# Patient Record
Sex: Female | Born: 1960 | Race: White | Hispanic: No | Marital: Married | Smoking: Former smoker
Health system: Southern US, Community
[De-identification: ages and names within clinical notes are randomized; demographics above are authoritative.]

## PROBLEM LIST (undated history)

## (undated) DIAGNOSIS — I10 Essential (primary) hypertension: Secondary | ICD-10-CM

## (undated) DIAGNOSIS — J45909 Unspecified asthma, uncomplicated: Secondary | ICD-10-CM

## (undated) DIAGNOSIS — R053 Chronic cough: Secondary | ICD-10-CM

## (undated) DIAGNOSIS — F419 Anxiety disorder, unspecified: Secondary | ICD-10-CM

## (undated) DIAGNOSIS — J449 Chronic obstructive pulmonary disease, unspecified: Secondary | ICD-10-CM

## (undated) HISTORY — PX: TUBAL LIGATION: SHX77

## (undated) HISTORY — DX: Unspecified asthma, uncomplicated: J45.909

## (undated) HISTORY — DX: Essential (primary) hypertension: I10

---

## 2007-07-19 DIAGNOSIS — G4700 Insomnia, unspecified: Secondary | ICD-10-CM | POA: Insufficient documentation

## 2007-07-19 DIAGNOSIS — D649 Anemia, unspecified: Secondary | ICD-10-CM | POA: Insufficient documentation

## 2007-07-19 DIAGNOSIS — F341 Dysthymic disorder: Secondary | ICD-10-CM | POA: Insufficient documentation

## 2009-08-13 DIAGNOSIS — I10 Essential (primary) hypertension: Secondary | ICD-10-CM | POA: Insufficient documentation

## 2017-08-07 HISTORY — PX: BRONCHOSCOPY: SUR163

## 2017-09-13 DIAGNOSIS — E559 Vitamin D deficiency, unspecified: Secondary | ICD-10-CM | POA: Insufficient documentation

## 2017-11-18 ENCOUNTER — Encounter: Payer: Self-pay | Admitting: Pulmonary Disease

## 2017-11-18 ENCOUNTER — Other Ambulatory Visit (INDEPENDENT_AMBULATORY_CARE_PROVIDER_SITE_OTHER): Payer: Managed Care, Other (non HMO)

## 2017-11-18 ENCOUNTER — Ambulatory Visit (INDEPENDENT_AMBULATORY_CARE_PROVIDER_SITE_OTHER): Payer: Managed Care, Other (non HMO) | Admitting: Pulmonary Disease

## 2017-11-18 ENCOUNTER — Ambulatory Visit (INDEPENDENT_AMBULATORY_CARE_PROVIDER_SITE_OTHER)
Admission: RE | Admit: 2017-11-18 | Discharge: 2017-11-18 | Disposition: A | Discharge status: Home / Self Care | Payer: Managed Care, Other (non HMO) | Source: Ambulatory Visit | Attending: Pulmonary Disease | Admitting: Pulmonary Disease

## 2017-11-18 VITALS — BP 110/82 | HR 94 | Ht 69.0 in | Wt 163.2 lb

## 2017-11-18 DIAGNOSIS — R05 Cough: Secondary | ICD-10-CM | POA: Diagnosis not present

## 2017-11-18 DIAGNOSIS — R059 Cough, unspecified: Secondary | ICD-10-CM

## 2017-11-18 LAB — POCT EXHALED NITRIC OXIDE: FENO LEVEL (PPB): 13

## 2017-11-18 LAB — CBC WITH DIFFERENTIAL/PLATELET
BASOS ABS: 0 10*3/uL (ref 0.0–0.1)
Basophils Relative: 0.8 % (ref 0.0–3.0)
EOS ABS: 0.1 10*3/uL (ref 0.0–0.7)
Eosinophils Relative: 1.1 % (ref 0.0–5.0)
HCT: 42.2 % (ref 36.0–46.0)
HEMOGLOBIN: 14.2 g/dL (ref 12.0–15.0)
LYMPHS PCT: 36.1 % (ref 12.0–46.0)
Lymphs Abs: 1.7 10*3/uL (ref 0.7–4.0)
MCHC: 33.5 g/dL (ref 30.0–36.0)
MCV: 95.9 fl (ref 78.0–100.0)
MONO ABS: 0.6 10*3/uL (ref 0.1–1.0)
Monocytes Relative: 12.4 % — ABNORMAL HIGH (ref 3.0–12.0)
Neutro Abs: 2.3 10*3/uL (ref 1.4–7.7)
Neutrophils Relative %: 49.6 % (ref 43.0–77.0)
Platelets: 213 10*3/uL (ref 150.0–400.0)
RBC: 4.4 Mil/uL (ref 3.87–5.11)
RDW: 12.4 % (ref 11.5–15.5)
WBC: 4.7 10*3/uL (ref 4.0–10.5)

## 2017-11-18 MED ORDER — AZELASTINE-FLUTICASONE 137-50 MCG/ACT NA SUSP: 1.0000 | Freq: Two times a day (BID) | NASAL | 3 refills | Status: DC

## 2017-11-18 NOTE — Patient Instructions (Signed)
We will start you on chlorpheniramine 8 mg 3 times daily and Dymista nasal spray We will get a chest x-ray today, check blood work including CBC differential and blood allergy profile I try to get records from your pulmonologist had done well regarding the workup Follow-up in 1 month.

## 2017-11-18 NOTE — Progress Notes (Signed)
Theresa Franco    [id number]    Jul 07, 1961  Primary Care Physician:Zimmer, [name], MD  Referring Physician: No referring provider defined for this encounter.  Chief complaint: Consult for evaluation of cough.  HPI: 56 year old with past medical history of hypertension, asthma, allergies.  She has history of chronic cough for several years.  She is evaluated by Dr. [name], Pulmonologist at [place], [place].  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  She was given Xolair but had to stop after 1 dose as she developed severe hypertension.  She has been tried on breo, trelegy without any improvement in symptoms.  Her peripheral blood count does not show any eosinophilia and she was not a candidate for anti-IL5 therapy.  As per the patient she underwent a bronchoscopy in September 2018 to evaluate for endobronchial lesions.  Microscopic did not show any abnormalities. Medications significant for losartan.  She had been off this for > 1 year with no change in cough.  It had to be restarted after she developed hypertension after Xolair.  She has cough which is mostly nonproductive in nature.  This is more at night and sometimes keeps her up.  She has dyspnea with exertion.  Denies dyspnea at rest, no wheezing, fevers, chills.  Denies any heartburn symptoms.  Pets: 2 dogs but no birds, cats Occupation: Medical lab tech Exposures: Different exposure, no mold at home Smoking history: 10-pack-year history.  Quit in 1993. Travel History: Not significant  Outpatient Encounter Medications as of 11/18/2017  Medication Sig  . ALPRAZolam (XANAX) 0.5 MG tablet Take 0.5 mg by mouth at bedtime as needed for anxiety.  Marland Kitchen buPROPion (WELLBUTRIN SR) 150 MG 12 hr tablet Take 150 mg by mouth 2 (two) times daily.  Marland Kitchen losartan-hydrochlorothiazide (HYZAAR) 50-12.5 MG tablet Take 1 tablet by mouth daily.  . montelukast (SINGULAIR) 10 MG tablet Take 10 mg by mouth at bedtime.  .  Fluticasone-Umeclidin-Vilant (TRELEGY ELLIPTA) 100-62.5-25 MCG/INH AEPB Inhale into the lungs.   No facility-administered encounter medications on file as of 11/18/2017.     Allergies as of 11/18/2017 - Review Complete 11/18/2017  Allergen Reaction Noted  . Xolair [omalizumab]  11/18/2017    Past Medical History:  Diagnosis Date  . Hypertension     Past Surgical History:  Procedure Laterality Date  . BRONCHOSCOPY  08/2017  . TUBAL LIGATION      Family History  Problem Relation Age of Onset  . Asthma Mother   . Hypertension Mother   . Asthma Father   . COPD Father   . Hypertension Father     Social History   Socioeconomic History  . Marital status: Married    Spouse name: Not on file  . Number of children: Not on file  . Years of education: Not on file  . Highest education level: Not on file  Social Needs  . Financial resource strain: Not on file  . Food insecurity - worry: Not on file  . Food insecurity - inability: Not on file  . Transportation needs - medical: Not on file  . Transportation needs - non-medical: Not on file  Occupational History  . Not on file  Tobacco Use  . Smoking status: Former Smoker    Packs/day: 0.50    Years: 20.00    Pack years: 10.00    Types: Cigarettes    Last attempt to quit: 11/18/1992    Years since quitting: 25.0  . Smokeless  tobacco: Never Used  Substance and Sexual Activity  . Alcohol use: Yes    Comment: 3/week  . Drug use: No  . Sexual activity: Not on file  Other Topics Concern  . Not on file  Social History Narrative  . Not on file    Review of systems: Review of Systems  Constitutional: Negative for fever and chills.  HENT: Negative.   Eyes: Negative for blurred vision.  Respiratory: as per HPI  Cardiovascular: Negative for chest pain and palpitations.  Gastrointestinal: Negative for vomiting, diarrhea, blood per rectum. Genitourinary: Negative for dysuria, urgency, frequency and hematuria.    Musculoskeletal: Negative for myalgias, back pain and joint pain.  Skin: Negative for itching and rash.  Neurological: Negative for dizziness, tremors, focal weakness, seizures and loss of consciousness.  Endo/Heme/Allergies: Negative for environmental allergies.  Psychiatric/Behavioral: Negative for depression, suicidal ideas and hallucinations.  All other systems reviewed and are negative.  Physical Exam: Blood pressure 110/82, pulse 94, height 5\' 9"  (1.753 m), weight 163 lb 3.2 oz (74 kg), SpO2 95 %. Gen:      No acute distress HEENT:  EOMI, sclera anicteric Neck:     No masses; no thyromegaly Lungs:    Clear to auscultation bilaterally; normal respiratory effort CV:         Regular rate and rhythm; no murmurs Abd:      + bowel sounds; soft, non-tender; no palpable masses, no distension Ext:    No edema; adequate peripheral perfusion Skin:      Warm and dry; no rash Neuro: alert and oriented x 3 Psych: normal mood and affect  Data Reviewed: FENO 11/18/17-13  Assessment:  Assessment for chronic cough Suspect chronic allergies, sinusitis with postnasal drip causing upper airway cough Suspicion for asthma is low as symptoms are not typical and FENO is low. Check chest x-ray, check blood work including CBC with differential, blood allergy profile Obtain records from [place] recent pulmonary workup including PFTs  Start chlorpheniramine 8 mg 3 times daily and Dymista nasal spray for treatment of postnasal drip.  If symptoms continue then we may consider starting PPI for treatment of silent reflux.  Plan/Recommendations: - Chest x-ray, CBC differential, blood allergy profile - Obtain medical records - Chlorpheniramine 8 mg 3 times daily and Dymista nasal spray.  [name] MD [place] Pager [phone] 11/18/2017, 3:51 PM  CC: No ref. provider found

## 2017-11-19 LAB — RESPIRATORY ALLERGY PROFILE REGION II ~~LOC~~
ALLERGEN, CEDAR TREE, T6: 0.29 kU/L — AB
ALLERGEN, D PTERNOYSSINUS, D1: 0.1 kU/L — AB
ALLERGEN, MULBERRY, T70: 0.31 kU/L — AB
ALLERGEN, OAK, T7: 0.36 kU/L — AB
Allergen, A. alternata, m6: <0.1 kU/L
Allergen, Comm Silver Birch, t9: 0.28 kU/L — ABNORMAL HIGH
Allergen, Cottonwood, t14: 0.37 kU/L — ABNORMAL HIGH
Bermuda Grass: 0.48 kU/L — ABNORMAL HIGH
Box Elder IgE: 0.91 kU/L — ABNORMAL HIGH
CAT DANDER: 0.53 kU/L — AB
CLADOSPORIUM HERBARUM (M2) IGE: <0.1 kU/L
CLASS: 0
CLASS: 0
CLASS: 0
CLASS: 0
CLASS: 0
CLASS: 0
CLASS: 0
CLASS: 0
CLASS: 1
CLASS: 2
COCKROACH: 0.33 kU/L — AB
COMMON RAGWEED (SHORT) (W1) IGE: 0.45 kU/L — ABNORMAL HIGH
Class: 0
Class: 0
Class: 0
Class: 0
Class: 0
Class: 1
Class: 1
Class: 1
Class: 1
Class: 1
Class: 1
Class: 1
Class: 1
Class: 1
D. FARINAE: 0.12 kU/L — AB
Dog Dander: 0.62 kU/L — ABNORMAL HIGH
ELM IGE: 0.33 kU/L — AB
IGE (IMMUNOGLOBULIN E), SERUM: 752 kU/L — AB (ref <=114)
JOHNSON GRASS: 0.45 kU/L — AB
Pecan/Hickory Tree IgE: 0.3 kU/L — ABNORMAL HIGH
ROUGH PIGWEED IGE: 0.45 kU/L — AB
Sheep Sorrel IgE: 0.45 kU/L — ABNORMAL HIGH
TIMOTHY GRASS: 0.59 kU/L — AB

## 2017-11-19 LAB — INTERPRETATION:

## 2017-12-15 ENCOUNTER — Ambulatory Visit: Payer: Self-pay | Admitting: Allergy & Immunology

## 2017-12-23 ENCOUNTER — Ambulatory Visit: Payer: Managed Care, Other (non HMO) | Admitting: Pulmonary Disease

## 2018-01-06 ENCOUNTER — Telehealth: Payer: Self-pay

## 2018-01-06 ENCOUNTER — Ambulatory Visit (INDEPENDENT_AMBULATORY_CARE_PROVIDER_SITE_OTHER): Payer: Managed Care, Other (non HMO) | Admitting: Pulmonary Disease

## 2018-01-06 ENCOUNTER — Encounter: Payer: Self-pay | Admitting: Pulmonary Disease

## 2018-01-06 VITALS — BP 108/70 | HR 76 | Ht 69.0 in | Wt 164.0 lb

## 2018-01-06 DIAGNOSIS — R05 Cough: Secondary | ICD-10-CM

## 2018-01-06 DIAGNOSIS — R059 Cough, unspecified: Secondary | ICD-10-CM

## 2018-01-06 LAB — NITRIC OXIDE: Nitric Oxide: 14

## 2018-01-06 MED ORDER — HYDROCODONE-HOMATROPINE 5-1.5 MG/5ML PO SYRP: 5.0000 mL | ORAL_SOLUTION | Freq: Four times a day (QID) | ORAL | 0 refills | Status: DC | PRN

## 2018-01-06 NOTE — Patient Instructions (Signed)
Continue the chlorphentermine and Dymista nasal spray Continue albuterol as needed We will try again to obtain the records from Dr. [name] I have reviewed your allergy profile.  There are allergies to multiple environmental agents including dust mite, dog, cat, pollen.  You may benefit from evaluation by allergy We will give you a prescription for Hycodan cough syrup. Follow-up in 1-2 months.

## 2018-01-06 NOTE — Telephone Encounter (Signed)
Medical release from has been sent to [facility] requesting PFT. Will await records.

## 2018-01-06 NOTE — Telephone Encounter (Signed)
Records have been received and placed in PM's cubby for review.  Nothing further is needed.  

## 2018-01-06 NOTE — Progress Notes (Addendum)
Theresa Franco    [id number]    [birth year]  Primary Care Physician:Zimmer, [name], MD  Referring Physician: Olena Mater, [place] [address]  Chief complaint: Follow up for cough.  HPI: 57 year old with past medical history of hypertension, asthma, allergies.  She has history of chronic cough for several years.  She is evaluated by Dr. [name], Pulmonologist at [place], [place].  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  She was given Xolair but had to stop after 1 dose as she developed severe hypertension.  She has been tried on breo, trelegy without any improvement in symptoms.  Her peripheral blood count does not show any eosinophilia and she was not a candidate for anti-IL5 therapy.  As per the patient she underwent a bronchoscopy in September 2018 to evaluate for endobronchial lesions.  Microscopic did not show any abnormalities. Medications significant for losartan.  She had been off this for > 1 year with no change in cough.  It had to be restarted after she developed hypertension after Xolair.  She has cough which is mostly nonproductive in nature.  This is more at night and sometimes keeps her up.  She has dyspnea with exertion.  Denies dyspnea at rest, no wheezing, fevers, chills.  Denies any heartburn symptoms.  Pets: 2 dogs but no birds, cats Occupation: Medical lab tech Exposures: Different exposure, no mold at home Smoking history: 10-pack-year history.  Quit in 1993. Travel History: Not significant  Interim history: She was started on chlorpheniramine and Dymista nasal spray at last visit.  She reports improvement in symptoms.  However over the past week she has viral upper respiratory tract infection with increasing congestion.  Cough with clear mucus.  Denies any fevers, chills  Outpatient Encounter Medications as of 01/06/2018  Medication Sig  . ALPRAZolam (XANAX) 0.5 MG tablet Take 0.5 mg by mouth at bedtime as needed  for anxiety.  . Azelastine-Fluticasone (DYMISTA) 137-50 MCG/ACT SUSP Place 1 spray into the nose 2 (two) times daily.  Marland Kitchen buPROPion (WELLBUTRIN SR) 150 MG 12 hr tablet Take 150 mg by mouth 2 (two) times daily.  Marland Kitchen losartan-hydrochlorothiazide (HYZAAR) 50-12.5 MG tablet Take 1 tablet by mouth daily.  . montelukast (SINGULAIR) 10 MG tablet Take 10 mg by mouth at bedtime.  . [DISCONTINUED] Fluticasone-Umeclidin-Vilant (TRELEGY ELLIPTA) 100-62.5-25 MCG/INH AEPB Inhale into the lungs.   No facility-administered encounter medications on file as of 01/06/2018.     Allergies as of 01/06/2018 - Review Complete 01/06/2018  Allergen Reaction Noted  . Xolair [omalizumab]  11/18/2017    Past Medical History:  Diagnosis Date  . Hypertension     Past Surgical History:  Procedure Laterality Date  . BRONCHOSCOPY  08/2017  . TUBAL LIGATION      Family History  Problem Relation Age of Onset  . Asthma Mother   . Hypertension Mother   . Asthma Father   . COPD Father   . Hypertension Father     Social History   Socioeconomic History  . Marital status: Married    Spouse name: Not on file  . Number of children: Not on file  . Years of education: Not on file  . Highest education level: Not on file  Social Needs  . Financial resource strain: Not on file  . Food insecurity - worry: Not on file  . Food insecurity - inability: Not on file  . Transportation needs - medical: Not on  file  . Transportation needs - non-medical: Not on file  Occupational History  . Not on file  Tobacco Use  . Smoking status: Former Smoker    Packs/day: 0.50    Years: 20.00    Pack years: 10.00    Types: Cigarettes    Last attempt to quit: 11/18/1992    Years since quitting: 25.1  . Smokeless tobacco: Never Used  Substance and Sexual Activity  . Alcohol use: Yes    Comment: 3/week  . Drug use: No  . Sexual activity: Not on file  Other Topics Concern  . Not on file  Social History Narrative  . Not on file    Review of systems: Review of Systems  Constitutional: Negative for fever and chills.  HENT: Negative.   Eyes: Negative for blurred vision.  Respiratory: as per HPI  Cardiovascular: Negative for chest pain and palpitations.  Gastrointestinal: Negative for vomiting, diarrhea, blood per rectum. Genitourinary: Negative for dysuria, urgency, frequency and hematuria.  Musculoskeletal: Negative for myalgias, back pain and joint pain.  Skin: Negative for itching and rash.  Neurological: Negative for dizziness, tremors, focal weakness, seizures and loss of consciousness.  Endo/Heme/Allergies: Negative for environmental allergies.  Psychiatric/Behavioral: Negative for depression, suicidal ideas and hallucinations.  All other systems reviewed and are negative.  Physical Exam: Blood pressure 108/70, pulse 76, height 5\' 9"  (1.753 m), weight 164 lb (74.4 kg), SpO2 99 %. Gen:      No acute distress HEENT:  EOMI, sclera anicteric Neck:     No masses; no thyromegaly Lungs:    Clear to auscultation bilaterally; normal respiratory effort CV:         Regular rate and rhythm; no murmurs Abd:      + bowel sounds; soft, non-tender; no palpable masses, no distension Ext:    No edema; adequate peripheral perfusion Skin:      Warm and dry; no rash Neuro: alert and oriented x 3 Psych: normal mood and affect  Data Reviewed: FENO 11/18/17-13 FENO 01/06/18-14  CBC 11/18/17-WBC 4.7, eosinophils 1.1%, absolute eos count 100 RAST panel 11/18/17-IgE 752, sensitive to multiple allergens including cats, dogs, dust mite, pollen  Chest x-ray 11/18/17- no active cardiopulmonary disease.  I have reviewed the images personally  Assessment:  Chronic cough Suspect chronic allergies, sinusitis with postnasal drip causing upper airway cough Suspicion for asthma is low as symptoms are not typical and FENO is low.  Multiple inhalers were tried in the past without any improvement.  Will continue on albuterol as  needed. Obtain records from [place] recent pulmonary workup including PFTs  Continue Singulair, chlorpheniramine 8 mg 3 times daily and Dymista nasal spray for treatment of postnasal drip.  If symptoms continue then we may consider starting PPI for treatment of silent reflux. Prescribed Hycodan cough syrup. Recommend allergy evaluation given high IgE levels with multiple allergens sensitivities.  Patient will schedule an appointment on her own  Plan/Recommendations: - Obtain medical records - Chlorpheniramine 8 mg 3 times daily and Dymista nasal spray. - Hycodan cough syrup  [name] MD [facility] Pager [phone] 01/06/2018, 9:44 AM  CC: Olena Mater, MD  Addendum Spirometry from [place], [place] 03/10/17 FVC 2.71 [71%], pre-FEV1 1.53 [54%], post FEV1 1.70 [59%], % change 11 F/F 63 Moderate obstruction with improvement in flow rates especially mid flows post bronchodilator suggestive of small airways disease.

## 2018-01-22 NOTE — Telephone Encounter (Signed)
Please let the pt know that I got the records.  I would like to repeat full PFTs here as it has been a year since the last test. Can order on day of return visit. Thanks  [name] MD [place] [facility] Pager [phone] If no answer or after 3pm call: [phone] 01/22/2018, 3:35 PM

## 2018-01-27 NOTE — Telephone Encounter (Signed)
Pt has been scheduled for PFT on 03/03/18 at 2:00. Pt is aware and voiced her understanding. Nothing further is needed.

## 2018-01-27 NOTE — Addendum Note (Signed)
Addended by: Maryanna Shape A on: 01/27/2018 08:54 AM   Modules accepted: Orders

## 2018-03-03 ENCOUNTER — Ambulatory Visit (INDEPENDENT_AMBULATORY_CARE_PROVIDER_SITE_OTHER): Payer: Managed Care, Other (non HMO) | Admitting: Pulmonary Disease

## 2018-03-03 ENCOUNTER — Encounter: Payer: Self-pay | Admitting: Pulmonary Disease

## 2018-03-03 ENCOUNTER — Other Ambulatory Visit (INDEPENDENT_AMBULATORY_CARE_PROVIDER_SITE_OTHER): Payer: Managed Care, Other (non HMO)

## 2018-03-03 VITALS — BP 118/70 | HR 89 | Ht 69.0 in | Wt 162.2 lb

## 2018-03-03 DIAGNOSIS — J441 Chronic obstructive pulmonary disease with (acute) exacerbation: Secondary | ICD-10-CM

## 2018-03-03 DIAGNOSIS — R05 Cough: Secondary | ICD-10-CM

## 2018-03-03 DIAGNOSIS — R0602 Shortness of breath: Secondary | ICD-10-CM

## 2018-03-03 DIAGNOSIS — J449 Chronic obstructive pulmonary disease, unspecified: Secondary | ICD-10-CM | POA: Diagnosis not present

## 2018-03-03 DIAGNOSIS — R059 Cough, unspecified: Secondary | ICD-10-CM

## 2018-03-03 LAB — CBC WITH DIFFERENTIAL/PLATELET
BASOS ABS: 0 10*3/uL (ref 0.0–0.1)
Basophils Relative: 0.3 % (ref 0.0–3.0)
EOS ABS: 0.2 10*3/uL (ref 0.0–0.7)
Eosinophils Relative: 1.1 % (ref 0.0–5.0)
HEMATOCRIT: 41.7 % (ref 36.0–46.0)
HEMOGLOBIN: 14.1 g/dL (ref 12.0–15.0)
LYMPHS PCT: 9 % — AB (ref 12.0–46.0)
Lymphs Abs: 1.3 10*3/uL (ref 0.7–4.0)
MCHC: 33.9 g/dL (ref 30.0–36.0)
MCV: 95.7 fl (ref 78.0–100.0)
MONO ABS: 0.9 10*3/uL (ref 0.1–1.0)
Monocytes Relative: 6 % (ref 3.0–12.0)
NEUTROS ABS: 12.1 10*3/uL — AB (ref 1.4–7.7)
Neutrophils Relative %: 83.6 % — ABNORMAL HIGH (ref 43.0–77.0)
PLATELETS: 227 10*3/uL (ref 150.0–400.0)
RBC: 4.36 Mil/uL (ref 3.87–5.11)
RDW: 12.9 % (ref 11.5–15.5)
WBC: 14.5 10*3/uL — AB (ref 4.0–10.5)

## 2018-03-03 LAB — PULMONARY FUNCTION TEST
DL/VA % PRED: 113 %
DL/VA: 6.05 ml/min/mmHg/L
DLCO unc % pred: 67 %
DLCO unc: 20.88 ml/min/mmHg
FEF 25-75 POST: 0.33 L/s
FEF 25-75 Pre: 0.4 L/sec
FEF2575-%CHANGE-POST: -18 %
FEF2575-%PRED-PRE: 13 %
FEF2575-%Pred-Post: 11 %
FEV1-%Change-Post: -3 %
FEV1-%Pred-Post: 31 %
FEV1-%Pred-Pre: 32 %
FEV1-PRE: 1.03 L
FEV1-Post: 1 L
FEV1FVC-%CHANGE-POST: 7 %
FEV1FVC-%PRED-PRE: 69 %
FEV6-%Change-Post: -7 %
FEV6-%Pred-Post: 42 %
FEV6-%Pred-Pre: 46 %
FEV6-Post: 1.7 L
FEV6-Pre: 1.83 L
FEV6FVC-%Change-Post: 2 %
FEV6FVC-%PRED-POST: 103 %
FEV6FVC-%Pred-Pre: 100 %
FVC-%Change-Post: -9 %
FVC-%PRED-PRE: 46 %
FVC-%Pred-Post: 41 %
FVC-POST: 1.7 L
FVC-PRE: 1.88 L
PRE FEV1/FVC RATIO: 55 %
Post FEV1/FVC ratio: 59 %
Post FEV6/FVC ratio: 100 %
Pre FEV6/FVC Ratio: 97 %
RV % pred: 176 %
RV: 3.77 L
TLC % PRED: 101 %
TLC: 5.89 L

## 2018-03-03 LAB — BASIC METABOLIC PANEL
BUN: 13 mg/dL (ref 6–23)
CO2: 30 mEq/L (ref 19–32)
Calcium: 9.6 mg/dL (ref 8.4–10.5)
Chloride: 102 mEq/L (ref 96–112)
Creatinine, Ser: 1.1 mg/dL (ref 0.40–1.20)
GFR: 54.82 mL/min — AB (ref >60.00)
Glucose, Bld: 115 mg/dL — ABNORMAL HIGH (ref 70–99)
POTASSIUM: 4 meq/L (ref 3.5–5.1)
SODIUM: 139 meq/L (ref 135–145)

## 2018-03-03 MED ORDER — FLUTICASONE-UMECLIDIN-VILANT 100-62.5-25 MCG/INH IN AEPB: 1.0000 | INHALATION_SPRAY | Freq: Every day | RESPIRATORY_TRACT | 5 refills | Status: DC

## 2018-03-03 MED ORDER — AZELASTINE-FLUTICASONE 137-50 MCG/ACT NA SUSP: 1.0000 | Freq: Two times a day (BID) | NASAL | 3 refills | Status: DC

## 2018-03-03 MED ORDER — HYDROCODONE-HOMATROPINE 5-1.5 MG/5ML PO SYRP: 5.0000 mL | ORAL_SOLUTION | Freq: Four times a day (QID) | ORAL | 0 refills | Status: DC | PRN

## 2018-03-03 MED ORDER — FLUTICASONE-UMECLIDIN-VILANT 100-62.5-25 MCG/INH IN AEPB: 1.0000 | INHALATION_SPRAY | Freq: Every day | RESPIRATORY_TRACT | 0 refills | Status: AC

## 2018-03-03 MED ORDER — IPRATROPIUM-ALBUTEROL 0.5-2.5 (3) MG/3ML IN SOLN: 3.0000 mL | Freq: Four times a day (QID) | RESPIRATORY_TRACT | 3 refills | Status: AC | PRN

## 2018-03-03 MED ORDER — PREDNISONE 10 MG PO TABS: ORAL_TABLET | ORAL | 0 refills | Status: DC

## 2018-03-03 MED ORDER — ALBUTEROL SULFATE HFA 108 (90 BASE) MCG/ACT IN AERS: 2.0000 | INHALATION_SPRAY | Freq: Four times a day (QID) | RESPIRATORY_TRACT | 3 refills | Status: DC | PRN

## 2018-03-03 NOTE — Patient Instructions (Addendum)
We will refill your pro-air We will get you started on trelegy, nebulizer with duo nebs every 6 hours as needed We will start you on a prednisone taper starting at 40 mg.  Reduce dose by 10 mg every 3 days. Check CBC differential, BMP, alpha-1 antitrypsin levels and phenotype Schedule for high-resolution CT of the chest Follow-up in 1 month.

## 2018-03-03 NOTE — Progress Notes (Signed)
PFT completed today.  

## 2018-03-03 NOTE — Progress Notes (Addendum)
Theresa Franco    [id number]    1961/03/27  Primary Care Physician:Zimmer, [name], MD  Referring Physician: Olena Mater, [place] [address]  Chief complaint: Follow up for cough, COPD  HPI: 57 year old with past medical history of hypertension, asthma, allergies.  She has history of chronic cough for several years.  She is evaluated by Dr. [name], Pulmonologist at [place], [place].  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  She was given Xolair but had to stop after 1 dose as she developed severe hypertension.  She has been tried on breo, trelegy without any improvement in symptoms.  Her peripheral blood count does not show any eosinophilia and she was not a candidate for anti-IL5 therapy.  As per the patient she underwent a bronchoscopy in September 2018 to evaluate for endobronchial lesions which did not show any abnormalities. Medications significant for losartan.  She had been off this for > 1 year with no change in cough.  It had to be restarted after she developed hypertension after Xolair.  She has cough which is mostly nonproductive in nature.  This is more at night and sometimes keeps her up.  She has dyspnea with exertion.  Denies dyspnea at rest, no wheezing, fevers, chills.  Denies any heartburn symptoms.  Pets: 2 dogs but no birds, cats Occupation: Medical lab tech Exposures: No exposure, no mold at home Smoking history: 10-pack-year history.  Quit in 1993. Travel History: Not significant  Interim history: Has worsening dyspnea over the past few weeks.  She has paroxysms of cough with panic attacks, wheezing.  Denies any sputum production, fevers, chills.  Outpatient Encounter Medications as of 03/03/2018  Medication Sig  . ALPRAZolam (XANAX) 0.5 MG tablet Take 0.5 mg by mouth at bedtime as needed for anxiety.  . Azelastine-Fluticasone (DYMISTA) 137-50 MCG/ACT SUSP Place 1 spray into the nose 2 (two) times daily.  Marland Kitchen  buPROPion (WELLBUTRIN SR) 150 MG 12 hr tablet Take 150 mg by mouth 2 (two) times daily.  Marland Kitchen losartan-hydrochlorothiazide (HYZAAR) 50-12.5 MG tablet Take 1 tablet by mouth daily.  . montelukast (SINGULAIR) 10 MG tablet Take 10 mg by mouth at bedtime.  . [DISCONTINUED] Azelastine-Fluticasone (DYMISTA) 137-50 MCG/ACT SUSP Place 1 spray into the nose 2 (two) times daily.  Marland Kitchen albuterol (PROAIR HFA) 108 (90 Base) MCG/ACT inhaler Inhale 2 puffs into the lungs every 6 (six) hours as needed for wheezing or shortness of breath.  Marland Kitchen HYDROcodone-homatropine (HYCODAN) 5-1.5 MG/5ML syrup Take 5 mLs by mouth every 6 (six) hours as needed for cough. (Patient not taking: Reported on 03/03/2018)   No facility-administered encounter medications on file as of 03/03/2018.     Allergies as of 03/03/2018 - Review Complete 03/03/2018  Allergen Reaction Noted  . Xolair [omalizumab]  11/18/2017    Past Medical History:  Diagnosis Date  . Hypertension     Past Surgical History:  Procedure Laterality Date  . BRONCHOSCOPY  08/2017  . TUBAL LIGATION      Family History  Problem Relation Age of Onset  . Asthma Mother   . Hypertension Mother   . Asthma Father   . COPD Father   . Hypertension Father     Social History   Socioeconomic History  . Marital status: Married    Spouse name: Not on file  . Number of children: Not on file  . Years of education: Not on file  . Highest education level:  Not on file  Occupational History  . Not on file  Social Needs  . Financial resource strain: Not on file  . Food insecurity:    Worry: Not on file    Inability: Not on file  . Transportation needs:    Medical: Not on file    Non-medical: Not on file  Tobacco Use  . Smoking status: Former Smoker    Packs/day: 0.50    Years: 20.00    Pack years: 10.00    Types: Cigarettes    Last attempt to quit: 11/18/1992    Years since quitting: 25.3  . Smokeless tobacco: Never Used  Substance and Sexual Activity  .  Alcohol use: Yes    Comment: 3/week  . Drug use: No  . Sexual activity: Not on file  Lifestyle  . Physical activity:    Days per week: Not on file    Minutes per session: Not on file  . Stress: Not on file  Relationships  . Social connections:    Talks on phone: Not on file    Gets together: Not on file    Attends religious service: Not on file    Active member of club or organization: Not on file    Attends meetings of clubs or organizations: Not on file    Relationship status: Not on file  . Intimate partner violence:    Fear of current or ex partner: Not on file    Emotionally abused: Not on file    Physically abused: Not on file    Forced sexual activity: Not on file  Other Topics Concern  . Not on file  Social History Narrative  . Not on file   Review of systems: Review of Systems  Constitutional: Negative for fever and chills.  HENT: Negative.   Eyes: Negative for blurred vision.  Respiratory: as per HPI  Cardiovascular: Negative for chest pain and palpitations.  Gastrointestinal: Negative for vomiting, diarrhea, blood per rectum. Genitourinary: Negative for dysuria, urgency, frequency and hematuria.  Musculoskeletal: Negative for myalgias, back pain and joint pain.  Skin: Negative for itching and rash.  Neurological: Negative for dizziness, tremors, focal weakness, seizures and loss of consciousness.  Endo/Heme/Allergies: Negative for environmental allergies.  Psychiatric/Behavioral: Negative for depression, suicidal ideas and hallucinations.  All other systems reviewed and are negative.  Physical Exam: Blood pressure 108/70, pulse 76, height 5\' 9"  (1.753 m), weight 164 lb (74.4 kg), SpO2 99 %. Gen:      No acute distress HEENT:  EOMI, sclera anicteric Neck:     No masses; no thyromegaly Lungs:    Clear to auscultation bilaterally; normal respiratory effort CV:         Regular rate and rhythm; no murmurs Abd:      + bowel sounds; soft, non-tender; no palpable  masses, no distension Ext:    No edema; adequate peripheral perfusion Skin:      Warm and dry; no rash Neuro: alert and oriented x 3 Psych: normal mood and affect  Data Reviewed: FENO 11/18/17-13 FENO 01/06/18-14  CBC 11/18/17-WBC 4.7, eosinophils 1.1%, absolute eos count 100 RAST panel 11/18/17-IgE 752, sensitive to multiple allergens including cats, dogs, dust mite, pollen  Chest x-ray 11/18/17- no active cardiopulmonary disease.  I have reviewed the images personally  Spirometry from [place], [place] 03/10/17 FVC 2.71 [71%], pre-FEV1 1.53 [54%], post FEV1 1.70 [59%], % change 11 F/F 63 Moderate obstruction with improvement in flow rates especially mid flows post bronchodilator suggestive of small airways  disease.  PFTs 03/03/18 FVC 1.70 (41%], FEV1 1.00 (31%], F/F 59, TLC 101, RV/TLC 173%, DLCO 67% Severe obstruction with air trapping, moderate diffusion defect  Assessment:  Severe COPD PFTs reviewed with severe obstruction, air trapping.  She appears to be in the middle of an exacerbation Give prednisone taper, start trelegy inhaler, duo nebs as needed Continue pro-air rescue inhaler Check alpha-1 antitrypsin levels as her obstruction is worse than expected relative to smoking history  Chronic cough Has chronic allergies, sinusitis with postnasal drip causing upper airway cough Suspicion for asthma is low as symptoms are not typical and FENO is low.  Continue Singulair, chlorpheniramine 8 mg 3 times daily and Dymista nasal spray for treatment of postnasal drip.  Consider starting PPI for treatment of silent reflux. She has an allergy consultation pending in the next few weeks Continue Hycodan cough syrup.  Plan/Recommendations: - Prednisone taper - Start trelegy, duo nebs - Hycodan cough syrup -Continue Singulair, chlorphentermine, Dymista - Allergy eval  [name] MD [facility] Pager [phone] 03/03/2018, 3:24 PM  CC: Olena Mater, MD

## 2018-03-09 ENCOUNTER — Encounter: Payer: Self-pay | Admitting: Pulmonary Disease

## 2018-03-09 NOTE — Telephone Encounter (Signed)
Dr Vaughan Browner,  Please advise on recent lab results, thanks

## 2018-03-10 ENCOUNTER — Ambulatory Visit (HOSPITAL_COMMUNITY)
Admission: RE | Admit: 2018-03-10 | Discharge: 2018-03-10 | Disposition: A | Discharge status: Home / Self Care | Payer: Managed Care, Other (non HMO) | Source: Ambulatory Visit | Attending: Pulmonary Disease | Admitting: Pulmonary Disease

## 2018-03-10 DIAGNOSIS — I700 Atherosclerosis of aorta: Secondary | ICD-10-CM | POA: Insufficient documentation

## 2018-03-10 DIAGNOSIS — R918 Other nonspecific abnormal finding of lung field: Secondary | ICD-10-CM | POA: Diagnosis not present

## 2018-03-10 DIAGNOSIS — R0602 Shortness of breath: Secondary | ICD-10-CM | POA: Diagnosis present

## 2018-03-10 LAB — ALPHA-1 ANTITRYPSIN PHENOTYPE: A-1 Antitrypsin, Ser: 204 mg/dL — ABNORMAL HIGH (ref 83–199)

## 2018-03-10 NOTE — Telephone Encounter (Signed)
Labs show slight elevation in WBC count and allergy testing confirms severe allergies. Other lab tests are normal.

## 2018-03-14 ENCOUNTER — Encounter: Payer: Self-pay | Admitting: Pulmonary Disease

## 2018-03-14 NOTE — Telephone Encounter (Signed)
Dr. Vaughan Browner,  Please advise on results of ct chest 03/11/18, thanks

## 2018-03-14 NOTE — Telephone Encounter (Signed)
There is mild inflammation in the lungs. We will need to review any inhalation exposures when she returns to office and maybe get additional lab tests. There are small lung nodules that appear benign. There are no worrisome findings. I will review in detail during office visit.

## 2018-03-15 NOTE — Telephone Encounter (Signed)
Passed on information to patient via email. Nothing further needed at this time.

## 2018-03-23 ENCOUNTER — Encounter: Payer: Self-pay | Admitting: Allergy & Immunology

## 2018-03-23 ENCOUNTER — Ambulatory Visit: Payer: Managed Care, Other (non HMO) | Admitting: Allergy & Immunology

## 2018-03-23 VITALS — BP 122/72 | HR 86 | Temp 98.1°F | Resp 17 | Ht 68.0 in | Wt 165.0 lb

## 2018-03-23 DIAGNOSIS — J449 Chronic obstructive pulmonary disease, unspecified: Secondary | ICD-10-CM

## 2018-03-23 DIAGNOSIS — J302 Other seasonal allergic rhinitis: Secondary | ICD-10-CM

## 2018-03-23 DIAGNOSIS — K219 Gastro-esophageal reflux disease without esophagitis: Secondary | ICD-10-CM

## 2018-03-23 DIAGNOSIS — J3089 Other allergic rhinitis: Secondary | ICD-10-CM | POA: Diagnosis not present

## 2018-03-23 MED ORDER — OMEPRAZOLE 40 MG PO CPDR: 40.0000 mg | DELAYED_RELEASE_CAPSULE | Freq: Every day | ORAL | 5 refills | Status: DC

## 2018-03-23 MED ORDER — CETIRIZINE HCL 10 MG PO TABS: 10.0000 mg | ORAL_TABLET | Freq: Every day | ORAL | 5 refills | Status: DC

## 2018-03-23 NOTE — Patient Instructions (Addendum)
1. Severe persistent asthma, uncomplicated - Lung testing did not look great today, but it was better than the one you had at the end of March.  - There was some very slight improvement with the nebulizer treatment. - We will not make changes at this time, but we will get a CBC to look for eosinphils (I do not want to get this now since you recently completed a round of prednisone). - I will also get some labs to look for ABPA.  - These labs can be drawn when you see Dr. Rolla Etienne later this month. - He may also want to look further into the hypersensitivity pneumonitis diagnosis. - We will get the outside records from Dr. Audie Box to see what he did. - I will also touch base with Dr. Rolla Etienne.  - Daily controller medication(s): Singulair 10mg  daily, Arnuity 284mcg one puff once daily and Trelegy 100/62.5/25 one puff once daily - Prior to physical activity: ProAir 2 puffs 10-15 minutes before physical activity. - Rescue medications: DuoNeb nebulizer one vial every 4-6 hours as needed - Asthma control goals:  * Full participation in all desired activities (may need albuterol before activity) * Albuterol use two time or less a week on average (not counting use with activity) * Cough interfering with sleep two time or less a month * Oral steroids no more than once a year * No hospitalizations  2. Seasonal and perennial allergic rhinitis (grasses, weeds, trees, dust mite, cat, dog, cockroach) - Testing today was negative to the molds tested. - Allergy shots are a possibility, but I think we need to get your breathing problems under better control. - Stop taking: chlorpheniramine - Continue with: Dymista (fluticasone/azelastine) two sprays per nostril 1-2 times daily as needed - Start taking: Zyrtec (cetirizine) 10mg  tablet once daily - You can use an extra dose of the antihistamine, if needed, for breakthrough symptoms.  - Consider nasal saline rinses 1-2 times daily to remove allergens from the nasal  cavities as well as help with mucous clearance (this is especially helpful to do before the nasal sprays are given) - Consider allergy shots as a means of long-term control. - Allergy shots "re-train" and "reset" the immune system to ignore environmental allergens and decrease the resulting immune response to those allergens (sneezing, itchy watery eyes, runny nose, nasal congestion, etc).    - Allergy shots improve symptoms in 75-85% of patients.  - We can discuss more at the next appointment if the medications are not working for you.  3. Gastroesophageal reflux disease - Stop the ranitidine and start omeprazole 40mg  daily.  4. Return in about 6 weeks (around 05/04/2018).   Please inform us of any Emergency Department visits, hospitalizations, or changes in symptoms. Call us before going to the ED for breathing or allergy symptoms since we might be able to fit you in for a sick visit. Feel free to contact us anytime with any questions, problems, or concerns.  It was a pleasure to meet you today!  Websites that have reliable patient information: 1. American Academy of Asthma, Allergy, and Immunology: www.aaaai.org 2. Food Allergy Research and Education (FARE): foodallergy.org 3. Mothers of Asthmatics: http://www.asthmacommunitynetwork.org 4. American College of Allergy, Asthma, and Immunology: www.acaai.org  Reducing Pollen Exposure  The American Academy of Allergy, Asthma and Immunology suggests the following steps to reduce your exposure to pollen during allergy seasons.    1. Do not hang sheets or clothing out to dry; pollen may collect on these items. 2. Do  not mow lawns or spend time around freshly cut grass; mowing stirs up pollen. 3. Keep windows closed at night.  Keep car windows closed while driving. 4. Minimize morning activities outdoors, a time when pollen counts are usually at their highest. 5. Stay indoors as much as possible when pollen counts or humidity is high and on  windy days when pollen tends to remain in the air longer. 6. Use air conditioning when possible.  Many air conditioners have filters that trap the pollen spores. 7. Use a HEPA room air filter to remove pollen form the indoor air you breathe.  Control of House Dust Mite Allergen    House dust mites play a major role in allergic asthma and rhinitis.  They occur in environments with high humidity wherever human skin, the food for dust mites is found. High levels have been detected in dust obtained from mattresses, pillows, carpets, upholstered furniture, bed covers, clothes and soft toys.  The principal allergen of the house dust mite is found in its feces.  A gram of dust may contain 1,000 mites and 250,000 fecal particles.  Mite antigen is easily measured in the air during house cleaning activities.    1. Encase mattresses, including the box spring, and pillow, in an air tight cover.  Seal the zipper end of the encased mattresses with wide adhesive tape. 2. Wash the bedding in water of 130 degrees Farenheit weekly.  Avoid cotton comforters/quilts and flannel bedding: the most ideal bed covering is the dacron comforter. 3. Remove all upholstered furniture from the bedroom. 4. Remove carpets, carpet padding, rugs, and non-washable window drapes from the bedroom.  Wash drapes weekly or use plastic window coverings. 5. Remove all non-washable stuffed toys from the bedroom.  Wash stuffed toys weekly. 6. Have the room cleaned frequently with a vacuum cleaner and a damp dust-mop.  The patient should not be in a room which is being cleaned and should wait 1 hour after cleaning before going into the room. 7. Close and seal all heating outlets in the bedroom.  Otherwise, the room will become filled with dust-laden air.  An electric heater can be used to heat the room. 8. Reduce indoor humidity to less than 50%.  Do not use a humidifier.  Control of Cockroach Allergen  Cockroach allergen has been  identified as an important cause of acute attacks of asthma, especially in urban settings.  There are fifty-five species of cockroach that exist in the [place], however only three, the [place], [place] species produce allergen that can affect patients with Asthma.  Allergens can be obtained from fecal particles, egg casings and secretions from cockroaches.    1. Remove food sources. 2. Reduce access to water. 3. Seal access and entry points. 4. Spray runways with 0.5-1% Diazinon or Chlorpyrifos 5. Blow boric acid power under stoves and refrigerator. 6. Place bait stations (hydramethylnon) at feeding sites.  Control of Dog or Cat Allergen  Avoidance is the best way to manage a dog or cat allergy. If you have a dog or cat and are allergic to dog or cats, consider removing the dog or cat from the home. If you have a dog or cat but don't want to find it a new home, or if your family wants a pet even though someone in the household is allergic, here are some strategies that may help keep symptoms at bay:  1. Keep the pet out of your bedroom and restrict it to only a  few rooms. Be advised that keeping the dog or cat in only one room will not limit the allergens to that room. 2. Don't pet, hug or kiss the dog or cat; if you do, wash your hands with soap and water. 3. High-efficiency particulate air (HEPA) cleaners run continuously in a bedroom or living room can reduce allergen levels over time. 4. Regular use of a high-efficiency vacuum cleaner or a central vacuum can reduce allergen levels. 5. Giving your dog or cat a bath at least once a week can reduce airborne allergen.

## 2018-03-23 NOTE — Progress Notes (Addendum)
NEW PATIENT  Date of Service/Encounter:  03/23/18  Referring provider: Olena Mater, MD   Assessment:   Severe persistent asthma/COPD overlap   Seasonal and perennial allergic rhinitis (grasses, weeds, trees, dust mite, cat, dog, cockroach)   Gastroesophageal reflux disease   Ms. Governale is a 57 y.o. female with a history of relatively mild asthma with COPD overlap for a period of time, which has recently worsened for unknown reasons. She is on excellent respiratory therapies, and unfortunately reacted rather harshly to initiation of Xolair. She does qualify for an anti-IL5 therapy with an AEC of 200 in March 2019. She has a strong atopic history with environmental allergies showed sensitizations to multiple allergens, although the levels were not as high as would be expected given her IgE level of 3000 within the last couple of years. She has no atopic dermatitis to explain this elevated IgE level.   In any case, her respiratory symptoms do not fit her relatively short history of smoking. An alpha-1 antitrypsin level has been normal. 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 Rolla Etienne for further workup of this. Another consideration is ABPA given the elevated IgE, although she did have a notable absence of IgE to Aspergillus on her recent environmental workup and the chest CT findings in ABPA are certainly distinct from what was found earlier this month. In any case, we will send an Aspergillus precipitins panel to provide more data in favor or or against this. We could also consider doing Aspergillus delayed hypersensitivity skin testing, if needed. Immunodeficiencies can present with interesting lung CT findings including ground glass opacities, but she has a notable absence of infections making this unlikely. We will also add on a PPI today to rule out silent GERD as a contributing factor to her breathing  difficulties.    Plan/Recommendations:    1. Severe persistent asthma, uncomplicated - Lung testing did not look great today, but it was slightly better than the one you had at the end of March.  - There was some very slight improvement with the nebulizer treatment, although this was not significant her ATS criteria.  - We will also get some labs to look for ABPA and will obtain a CBC with differential to evaluate for a higher eosinophil count and ensure that her recently elevated WBC decreased.  - We could do an immunodeficiency screen to rule out immune disorders as a contributing factor to her lung disease.  - Labs to evaluate the quantitative aspects of her immune system: IgG/IgA/IgM, CBC with differential  - Labs to evaluate the qualitative aspects of her immune system: CH50, Pneumococcal/Tetanus/Diphtheria titers - She will have these labs drawn later this month when she sees Dr. Rolla Etienne.  - I will defer to Dr. Rolla Etienne regarding future workup for hypersensitivity pneumonitis diagnosis. - We will get the outside records from Dr. Audie Box to see what he did.  - Daily controller medication(s): Singulair 10mg  daily, Arnuity 265mcg one puff once daily and Trelegy 100/62.5/25 one puff once daily - Prior to physical activity: ProAir 2 puffs 10-15 minutes before physical activity. - Rescue medications: DuoNeb nebulizer one vial every 4-6 hours as needed - Asthma control goals:  * Full participation in all desired activities (may need albuterol before activity) * Albuterol use two time or less a week on average (not counting use with activity) * Cough interfering with sleep two time or less a month * Oral steroids no more than  once a year * No hospitalizations  2. Seasonal and perennial allergic rhinitis (grasses, weeds, trees, dust mite, cat, dog, cockroach) - Testing today was negative to the molds tested. - Allergy shots are a possibility, but I think we need to get your breathing problems  under better control. - Stop taking: chlorpheniramine since it requires such frequent dosing - Continue with: Dymista (fluticasone/azelastine) two sprays per nostril 1-2 times daily as needed - Start taking: Zyrtec (cetirizine) 10mg  tablet once daily - You can use an extra dose of the antihistamine, if needed, for breakthrough symptoms.  - Consider nasal saline rinses 1-2 times daily to remove allergens from the nasal cavities as well as help with mucous clearance (this is especially helpful to do before the nasal sprays are given) - Consider allergy shots as a means of long-term control. - Allergy shots "re-train" and "reset" the immune system to ignore environmental allergens and decrease the resulting immune response to those allergens (sneezing, itchy watery eyes, runny nose, nasal congestion, etc).    - Allergy shots improve symptoms in 75-85% of patients.  - We can discuss more at the next appointment if the medications are not working for you. - I would want her breathing under better control prior to initiating allergen immunotherapy.  3. Gastroesophageal reflux disease - Stop the ranitidine and start omeprazole 40mg  daily.  4. Return in about 6 weeks (around 05/04/2018).  Subjective:   Jacolyn Joaquin is a 57 y.o. female presenting today for evaluation of  Chief Complaint  Patient presents with  . Cough  . Shortness of Breath    Arlyne Brandes has a history of the following: Patient Active Problem List   Diagnosis Date Noted  . COPD with asthma ([place]) 03/26/2018  . Seasonal and perennial allergic rhinitis 03/26/2018  . Gastroesophageal reflux disease 03/26/2018    History obtained from: chart review and patient.  Foster Sonnier was referred by Olena Mater, MD.     [place]aceland is a 57 y.o. female presenting for an allergy and asthma evaluation. Her history was obtained from the EMR as well as the patient.   Ms. Taylor was diagnosed with asthma "years ago" and was followed  by a Pulmonologist (Dr. Audie Box) in [place]. He did perform a bronchoscopy around one year ago. There was nothing found, per the patient. She is unsure whether any biopsies were taken or if any cultures were sent. She had endobronchial lesions noted at this time (unsure when these were first discovered), and analysis of the lesions showed no abnormalities. Dr. Audie Box had "expected them to be filled with mucous" but they were not. Per the patient. Evidently she had an IgE at that time that qualified her for Xolair (over 3000 per Dr. Alonna Buckler consultation note from 11/18/17). She reacted to the [place] around one year ago (received one injection and her BP went into the 200s immediately, although we do not have these notes with [place] today). Peripheral blood count did not initially qualify her for an anti-IL5 agent, including a more recent CBC in December 2018. But a CBC in March 2019 did show an AEC of 200. She has not been on chronic prednisone.   She has been on an ARB in the past, but this was stopped to see if this improved her cough. However, following her redaction to Xolair this was restarted. Cessation of it did not change the trajectory of her cough.   Ms. Rice was seen by Dr. Benay Pillow in December 2018 as a second  opinion. The cough was her main concern initially but at this time she was having shortness of breath. She was on prednisone for ten days which did provide a lot of improvement. She is Trelegy one puff once daily as well as albuterol nebulizer and ipratropium nebulizer treatments as needed (twice daily). She does feel that she needs the nebulizer treatments, as opposed to these being a habit at this time. In any case, she does not feel that the Trelegy has done much, but she has continued it nonetheless. She is on Singulair as well. She was on Breo in the past without improvement. She denies fevers and chills with this constellation of symptoms. She had a normal alpha-1 anti-trypsin level.  She followed up with Dr. Rolla Etienne at the end of March with shortness of breath and intense coughing. She was diagnosed with a severe COPD exacerbation and treated with the prednisone with improvement in her symptoms.   She did have a chest CT performed in April 2019 that was reported to her a "no worrisome findings" per the patient. She has not talked about it in detail (April 29th is her next appointment). Read of the CT demonstrated mild air trapping with ground glass centrilobular micronodularity, suggestive of subacute hypersensitivity pneumonitis. There were also tiny solid pulmonary nodules noted, with the largest being 3 mm. Full report below.   She denies any concerning exposures. She does work as a Quarry manager in [employer], however they are actually doing fewer tests in house than they were in the past. She has two dogs in the home, but denies exposures to any birds. She did spend a lot of time in a family dry cleaners when she was a child. She does have a very remote 15 pack year history of smoking, but has stopped for 10 or more years at this point. She denies recent travel, but does endorse fever for the last two months, likely secondary to ongoing coughing.   Ms. Perdue does report a history of GERD. She did take an H2 blocker for a period of time, but this was around two weeks only and was years ago. She has never been on omeprazole. She does have some allergic rhinitis symptoms including postnasal drip, but this is not a huge concern for her. She did have an environmental panel sent in December 2018 (at Dr. Alonna Buckler first visit) which showed low positives to the entire panel aside from molds with a total IgE of 752. She has never been on allergen immunotherapy and does not use a nasal steroid on a routine basis. Currently she is on chlorpheniramine TID. This does help somewhat, but it is rather difficult to tell. She is also on Dymista.   Otherwise, there is no history of other atopic diseases,  including drug allergies, food allergies, stinging insect allergies, or urticaria. There is no significant infectious history and she cannot remember the last time that she needed an antibiotic. Vaccinations are up to date.   Full Pulmonary Function Testing (March 2019):    HR Chest CT (April 2019):  TECHNIQUE: Multidetector CT imaging of the chest was performed following the standard protocol without intravenous contrast. High resolution imaging of the lungs, as well as inspiratory and expiratory imaging, was performed.  COMPARISON:  11/18/2017 chest radiograph.  FINDINGS: Cardiovascular: Normal heart size. No significant pericardial fluid/thickening. Minimally atherosclerotic nonaneurysmal thoracic aorta. Normal caliber pulmonary arteries.  Mediastinum/Nodes: No discrete thyroid nodules. Unremarkable esophagus. No pathologically enlarged axillary, mediastinal or gross hilar lymph nodes,  noting limited sensitivity for the detection of hilar adenopathy on this noncontrast study.  Lungs/Pleura: No pneumothorax. No pleural effusion. There is a mild mosaic attenuation throughout both lungs, compatible with mild air trapping on the expiration sequence. No acute consolidative airspace disease or lung masses. There is mild patchy upper lobe predominant ground-glass centrilobular micronodularity in the lungs. No significant regions of subpleural reticulation, traction bronchiectasis, architectural distortion or frank honeycombing. Tiny parenchymal bands in the lingula and right middle lobe are compatible with minimal postinfectious/postinflammatory scarring. There are two tiny 3 mm solid pulmonary nodules (series 8/image 116 in the medial right lower lobe and image 98 in the lingula).  Upper abdomen: No acute abnormality.  Musculoskeletal: No aggressive appearing focal osseous lesions. Mild thoracic spondylosis.  IMPRESSION: Mild patchy air trapping. Mild patchy upper lung predominant  ground-glass centrilobular micronodularity. These findings are suggestive of subacute hypersensitivity pneumonitis. Follow-up high-resolution chest CT study may be considered in 12 months to assess temporal pattern stability, as clinically warranted.  Tiny solid pulmonary nodules, largest 3 mm. No follow-up needed if patient is low-risk (and has no known or suspected primary neoplasm). Non-contrast chest CT can be considered in 12 months if patient is high-risk. This recommendation follows the consensus statement: Guidelines for Management of Incidental Pulmonary Nodules Detected on CT Images:From the Fleischner Society 2017; published online before print (10.1148/radiol.0762263335).   Past Medical History: Patient Active Problem List   Diagnosis Date Noted  . COPD with asthma ([place]) 03/26/2018  . Seasonal and perennial allergic rhinitis 03/26/2018  . Gastroesophageal reflux disease 03/26/2018    Medication List:  Allergies as of 03/23/2018      Reactions   Xolair [omalizumab]       Medication List        Accurate as of 03/23/18 11:59 PM. Always use your most recent med list.          albuterol 108 (90 Base) MCG/ACT inhaler Commonly known as:  PROAIR HFA Inhale 2 puffs into the lungs every 6 (six) hours as needed for wheezing or shortness of breath.   ALPRAZolam 0.5 MG tablet Commonly known as:  XANAX Take 0.5 mg by mouth at bedtime as needed for anxiety.   Azelastine-Fluticasone 137-50 MCG/ACT Susp Commonly known as:  DYMISTA Place 1 spray into the nose 2 (two) times daily.   buPROPion 150 MG 12 hr tablet Commonly known as:  WELLBUTRIN SR Take 150 mg by mouth 2 (two) times daily.   cetirizine 10 MG tablet Commonly known as:  ZYRTEC Take 1 tablet (10 mg total) by mouth daily.   Fluticasone-Umeclidin-Vilant 100-62.5-25 MCG/INH Aepb Commonly known as:  TRELEGY ELLIPTA Inhale 1 puff into the lungs daily.   ipratropium-albuterol 0.5-2.5 (3) MG/3ML Soln Commonly known  as:  DUONEB Take 3 mLs by nebulization every 6 (six) hours as needed.   losartan-hydrochlorothiazide 50-12.5 MG tablet Commonly known as:  HYZAAR Take 1 tablet by mouth daily.   montelukast 10 MG tablet Commonly known as:  SINGULAIR Take 10 mg by mouth at bedtime.   omeprazole 40 MG capsule Commonly known as:  PRILOSEC Take 1 capsule (40 mg total) by mouth daily.       Birth History: non-contributory.    Developmental History: non-contributory.   Past Surgical History: Past Surgical History:  Procedure Laterality Date  . BRONCHOSCOPY  08/2017  . TUBAL LIGATION       Family History: Family History  Problem Relation Age of Onset  . Asthma Mother   . Hypertension Mother   .  Asthma Father   . COPD Father   . Hypertension Father      Social History: Michaline lives at home with her family. They live in a house that is 55 years old. There is hardwood throughout the home with carpeting in the bedrooms. There is electric heating and central cooling. There are two dogs in the home. There are no dust mite coverings on the bedding. There is no current tobacco exposure. She works as a Engineering geologist. She smoked for 15 pack years 20+ years ago.      Review of Systems: a 14-point review of systems is pertinent for what is mentioned in HPI.  Otherwise, all other systems were negative. Constitutional: negative other than that listed in the HPI Eyes: negative other than that listed in the HPI Ears, nose, mouth, throat, and face: negative other than that listed in the HPI Respiratory: negative other than that listed in the HPI Cardiovascular: negative other than that listed in the HPI Gastrointestinal: negative other than that listed in the HPI Genitourinary: negative other than that listed in the HPI Integument: negative other than that listed in the HPI Hematologic: negative other than that listed in the HPI Musculoskeletal: negative other than that listed in the  HPI Neurological: negative other than that listed in the HPI Allergy/Immunologic: negative other than that listed in the HPI    Objective:   Blood pressure 122/72, pulse 86, temperature 98.1 F (36.7 C), resp. rate 17, height 5\' 8"  (1.727 m), weight 165 lb (74.8 kg), SpO2 96 %. Body mass index is 25.09 kg/m.   Physical Exam:  General: Alert, interactive, in no acute distress. Very pleasant and smiling.  Eyes: No conjunctival injection bilaterally, no discharge on the right, no discharge on the left and no Horner-Trantas dots present. PERRL bilaterally. EOMI without pain. No photophobia.  Ears: Right TM pearly gray with normal light reflex, Left TM pearly gray with normal light reflex, Right TM intact without perforation and Left TM intact without perforation.  Nose/Throat: External nose within normal limits and septum midline. Turbinates edematous with clear discharge. Posterior oropharynx erythematous without cobblestoning in the posterior oropharynx. Tonsils 2+ without exudates.  Tongue without thrush. Neck: Supple without thyromegaly. Trachea midline. Adenopathy: no enlarged lymph nodes appreciated in the anterior cervical, occipital, axillary, epitrochlear, inguinal, or popliteal regions. Lungs: Clear to auscultation without wheezing, rhonchi or rales. No increased work of breathing. CV: Normal S1/S2. No murmurs. Capillary refill <2 seconds.  Abdomen: Nondistended, nontender. No guarding or rebound tenderness. Bowel sounds present in all fields and hypoactive  Skin: Warm and dry, without lesions or rashes. Extremities:  No clubbing, cyanosis or edema. Neuro:   Grossly intact. No focal deficits appreciated. Responsive to questions.   Diagnostic studies:   Spirometry: results abnormal (FEV1: 1.23/41%, FVC: 2.39/63%, FEV1/FVC: 51%).    Spirometry consistent with mixed obstructive and restrictive disease. Albuterol/Atrovent nebulizer treatment given in clinic with improvement in  FEV1, but not significant per ATS criteria. Her FEF25-75% did improve slightly.     Allergy Studies: none  Selected Indoor/Outdoor Percutaneous Adult Environmental Panel (mold panel only): negative to all of our molds with adequate controls.   Allergy testing results were read and interpreted by myself, documented by clinical staff.     Salvatore Marvel, MD [facility]

## 2018-03-26 ENCOUNTER — Encounter: Payer: Self-pay | Admitting: Allergy & Immunology

## 2018-03-26 DIAGNOSIS — J449 Chronic obstructive pulmonary disease, unspecified: Secondary | ICD-10-CM | POA: Insufficient documentation

## 2018-03-26 DIAGNOSIS — J3089 Other allergic rhinitis: Secondary | ICD-10-CM

## 2018-03-26 DIAGNOSIS — J302 Other seasonal allergic rhinitis: Secondary | ICD-10-CM | POA: Insufficient documentation

## 2018-03-26 DIAGNOSIS — K219 Gastro-esophageal reflux disease without esophagitis: Secondary | ICD-10-CM | POA: Insufficient documentation

## 2018-03-31 ENCOUNTER — Telehealth: Payer: Self-pay | Admitting: Pulmonary Disease

## 2018-03-31 NOTE — Telephone Encounter (Signed)
Per Dr. Vaughan Browner- obtain bronch note, last ov, labs and imaging from [facility] pulmonary.  I have requested that these records be faxed to our office.   Will hold message until records are received.

## 2018-04-01 NOTE — Telephone Encounter (Signed)
Records have ben received and have been placed in Dr. Matilde Bash look at. Nothing further is needed.

## 2018-04-04 ENCOUNTER — Other Ambulatory Visit: Payer: Self-pay | Admitting: Pulmonary Disease

## 2018-04-04 ENCOUNTER — Other Ambulatory Visit (INDEPENDENT_AMBULATORY_CARE_PROVIDER_SITE_OTHER): Payer: Managed Care, Other (non HMO)

## 2018-04-04 ENCOUNTER — Ambulatory Visit (INDEPENDENT_AMBULATORY_CARE_PROVIDER_SITE_OTHER): Payer: Managed Care, Other (non HMO) | Admitting: Pulmonary Disease

## 2018-04-04 ENCOUNTER — Encounter: Payer: Self-pay | Admitting: Pulmonary Disease

## 2018-04-04 VITALS — BP 122/84 | HR 88 | Ht 69.0 in | Wt 166.0 lb

## 2018-04-04 DIAGNOSIS — J449 Chronic obstructive pulmonary disease, unspecified: Secondary | ICD-10-CM

## 2018-04-04 DIAGNOSIS — J441 Chronic obstructive pulmonary disease with (acute) exacerbation: Secondary | ICD-10-CM | POA: Diagnosis not present

## 2018-04-04 DIAGNOSIS — J679 Hypersensitivity pneumonitis due to unspecified organic dust: Secondary | ICD-10-CM | POA: Insufficient documentation

## 2018-04-04 LAB — CBC WITH DIFFERENTIAL/PLATELET
BASOS PCT: 0.8 % (ref 0.0–3.0)
Basophils Absolute: 0 10*3/uL (ref 0.0–0.1)
EOS PCT: 2.1 % (ref 0.0–5.0)
Eosinophils Absolute: 0.1 10*3/uL (ref 0.0–0.7)
HCT: 42.1 % (ref 36.0–46.0)
HEMOGLOBIN: 14 g/dL (ref 12.0–15.0)
Lymphocytes Relative: 39.9 % (ref 12.0–46.0)
Lymphs Abs: 1.5 10*3/uL (ref 0.7–4.0)
MCHC: 33.3 g/dL (ref 30.0–36.0)
MCV: 95.6 fl (ref 78.0–100.0)
MONO ABS: 0.5 10*3/uL (ref 0.1–1.0)
Monocytes Relative: 13.9 % — ABNORMAL HIGH (ref 3.0–12.0)
Neutro Abs: 1.6 10*3/uL (ref 1.4–7.7)
Neutrophils Relative %: 43.3 % (ref 43.0–77.0)
Platelets: 206 10*3/uL (ref 150.0–400.0)
RBC: 4.4 Mil/uL (ref 3.87–5.11)
RDW: 12.9 % (ref 11.5–15.5)
WBC: 3.7 10*3/uL — AB (ref 4.0–10.5)

## 2018-04-04 LAB — PROTIME-INR
INR: 0.9 ratio (ref 0.8–1.0)
PROTHROMBIN TIME: 11 s (ref 9.6–13.1)

## 2018-04-04 LAB — NITRIC OXIDE: Nitric Oxide: 7

## 2018-04-04 MED ORDER — HYDROCODONE-HOMATROPINE 5-1.5 MG/5ML PO SYRP: 5.0000 mL | ORAL_SOLUTION | Freq: Four times a day (QID) | ORAL | 0 refills | Status: DC | PRN

## 2018-04-04 NOTE — Progress Notes (Addendum)
Theresa Franco    [id number]    [birth year]  Primary Care Physician:Zimmer, [name], MD  Referring Physician: Olena Mater, [place] [address]  Chief complaint: Follow up for cough, COPD, asthma.  HPI: 57 year old with past medical history of hypertension, asthma, allergies.  She has history of chronic cough for several years.  She is evaluated by Dr. [name], Pulmonologist at [place], [place].  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  She was given Xolair but had to stop after 1 dose as she developed severe hypertension.  She has been tried on breo, trelegy without any improvement in symptoms.  Her peripheral blood count does not show any eosinophilia and she was not a candidate for anti-IL5 therapy.  As per the patient she underwent a bronchoscopy in September 2018 to evaluate for endobronchial lesions which did not show any abnormalities. Medications significant for losartan.  She had been off this for > 1 year with no change in cough.  It had to be restarted after she developed hypertension after Xolair.  She has cough which is mostly nonproductive in nature.  This is more at night and sometimes keeps her up.  She has dyspnea with exertion.  Denies dyspnea at rest, no wheezing, fevers, chills.  Denies any heartburn symptoms.  Pets: 2 dogs but no birds, cats Occupation: Medical lab tech Exposures: No exposure, no mold at home Smoking history: 10-pack-year history.  Quit in 1993. Travel History: Not significant  Interim history: Seen for an exacerbation at last office visit.  Treated with prednisone, trelegy. , Hycodan cough syrup. She states that her dyspnea is better but cough persists.    Seen by Dr. [name], Allergy.  Labs for ABPA, quantitative immunoglobulins, complement ordered.  Ranitidine was stopped and she was started on omeprazole.  Outpatient Encounter Medications as of 04/04/2018  Medication Sig  . albuterol (PROAIR  HFA) 108 (90 Base) MCG/ACT inhaler Inhale 2 puffs into the lungs every 6 (six) hours as needed for wheezing or shortness of breath.  . ALPRAZolam (XANAX) 0.5 MG tablet Take 0.5 mg by mouth at bedtime as needed for anxiety.  . Azelastine-Fluticasone (DYMISTA) 137-50 MCG/ACT SUSP Place 1 spray into the nose 2 (two) times daily.  Marland Kitchen buPROPion (WELLBUTRIN SR) 150 MG 12 hr tablet Take 150 mg by mouth 2 (two) times daily.  . cetirizine (ZYRTEC) 10 MG tablet Take 1 tablet (10 mg total) by mouth daily.  . Fluticasone-Umeclidin-Vilant (TRELEGY ELLIPTA) 100-62.5-25 MCG/INH AEPB Inhale 1 puff into the lungs daily.  Marland Kitchen ipratropium-albuterol (DUONEB) 0.5-2.5 (3) MG/3ML SOLN Take 3 mLs by nebulization every 6 (six) hours as needed.  Marland Kitchen losartan-hydrochlorothiazide (HYZAAR) 50-12.5 MG tablet Take 1 tablet by mouth daily.  . montelukast (SINGULAIR) 10 MG tablet Take 10 mg by mouth at bedtime.  Marland Kitchen omeprazole (PRILOSEC) 40 MG capsule Take 1 capsule (40 mg total) by mouth daily.   No facility-administered encounter medications on file as of 04/04/2018.     Allergies as of 04/04/2018 - Review Complete 04/04/2018  Allergen Reaction Noted  . Amlodipine Swelling 11/09/2014  . Xolair [omalizumab]  11/18/2017    Past Medical History:  Diagnosis Date  . Asthma   . Hypertension     Past Surgical History:  Procedure Laterality Date  . BRONCHOSCOPY  08/2017  . TUBAL LIGATION      Family History  Problem Relation Age of Onset  . Asthma Mother   . Hypertension  Mother   . Asthma Father   . COPD Father   . Hypertension Father     Social History   Socioeconomic History  . Marital status: Married    Spouse name: Not on file  . Number of children: Not on file  . Years of education: Not on file  . Highest education level: Not on file  Occupational History  . Not on file  Social Needs  . Financial resource strain: Not on file  . Food insecurity:    Worry: Not on file    Inability: Not on file  .  Transportation needs:    Medical: Not on file    Non-medical: Not on file  Tobacco Use  . Smoking status: Former Smoker    Packs/day: 0.50    Years: 20.00    Pack years: 10.00    Types: Cigarettes    Last attempt to quit: 11/18/1992    Years since quitting: 25.3  . Smokeless tobacco: Never Used  Substance and Sexual Activity  . Alcohol use: Yes    Comment: 3/week  . Drug use: No  . Sexual activity: Not on file  Lifestyle  . Physical activity:    Days per week: Not on file    Minutes per session: Not on file  . Stress: Not on file  Relationships  . Social connections:    Talks on phone: Not on file    Gets together: Not on file    Attends religious service: Not on file    Active member of club or organization: Not on file    Attends meetings of clubs or organizations: Not on file    Relationship status: Not on file  . Intimate partner violence:    Fear of current or ex partner: Not on file    Emotionally abused: Not on file    Physically abused: Not on file    Forced sexual activity: Not on file  Other Topics Concern  . Not on file  Social History Narrative  . Not on file   Review of systems: Review of Systems  Constitutional: Negative for fever and chills.  HENT: Negative.   Eyes: Negative for blurred vision.  Respiratory: as per HPI  Cardiovascular: Negative for chest pain and palpitations.  Gastrointestinal: Negative for vomiting, diarrhea, blood per rectum. Genitourinary: Negative for dysuria, urgency, frequency and hematuria.  Musculoskeletal: Negative for myalgias, back pain and joint pain.  Skin: Negative for itching and rash.  Neurological: Negative for dizziness, tremors, focal weakness, seizures and loss of consciousness.  Endo/Heme/Allergies: Negative for environmental allergies.  Psychiatric/Behavioral: Negative for depression, suicidal ideas and hallucinations.  All other systems reviewed and are negative.  Physical Exam: Blood pressure 122/84,  pulse 88, height _0  (1.753 m), weight 166 lb (75.3 kg), SpO2 98 %. Gen:      No acute distress HEENT:  EOMI, sclera anicteric Neck:     No masses; no thyromegaly Lungs:    Clear to auscultation bilaterally; normal respiratory effort CV:         Regular rate and rhythm; no murmurs Abd:      + bowel sounds; soft, non-tender; no palpable masses, no distension Ext:    No edema; adequate peripheral perfusion Skin:      Warm and dry; no rash Neuro: alert and oriented x 3 Psych: normal mood and affect  Data Reviewed: FENO 11/18/17-13 FENO 01/06/18-14 FENO 04/04/2018-7  CBC 11/18/17-WBC 4.7, eosinophils 1.1%, absolute eos count 100 RAST panel 11/18/17-IgE 752,  sensitive to multiple allergens including cats, dogs, dust mite, pollen  Chest x-ray 11/18/17- no active cardiopulmonary disease.   High-resolution CT 03/10/2018-subtle centrilobular groundglass opacities, air trapping in the upper lobes.  Tiny pulmonary nodules the largest is 3 mm. I have reviewed the images personally.  Spirometry from [place], [place] 03/10/17 FVC 2.71 [71%], pre-FEV1 1.53 [54%], post FEV1 1.70 [59%], % change 11 F/F 63 ABG 04/07/2017-7.4 2/40/73 Moderate obstruction with improvement in flow rates especially mid flows post bronchodilator suggestive of small airways disease.    PFTs 03/03/18 FVC 1.70 (41%], FEV1 1.00 (31%], F/F 59, TLC 101, RV/TLC 173%, DLCO 67% Severe obstruction with air trapping, moderate diffusion defect  Assessment:  Severe COPD, asthmatic bronchitis PFTs reviewed with severe obstruction, air trapping.  The degree of obstruction is out of proportion to her smoking.  However alpha-1 antitrypsin levels are negative She continues on trelegy, albuterol as needed  Allergic rhinitis, Being followed at the allergy clinic Labs for ABPA are pending  GERD On omeprazole  Abnormal CT scan High-resolution CT reviewed with subtle centrilobular nodularity in the upper lobe with air trapping.  This  is very nonspecific and may indicate hypersensitivity pneumonitis or eosinophilic PNA but she does not endorse any specific exposure. CBC does not show any peripheral eosinophilia. We will check hypersensitivity panel, ANA, rheumatoid factor Schedule bronchoscope with BAL to evaluate for cellularity, CD4 CD8 ratio.  Risk benefit discussed with patient and she is ok to proceed  More then 1/2 the time of the 40 min visit was spent in counseling and/or coordination of care with the patient and family.  Plan/Recommendations: - Continue trelegy, duo nebs - Hycodan cough syrup - Check ANA, RF, CCP, Hypersensitivity panel - Schedule bronchoscope   [name] MD [facility] 04/04/2018, 2:27 PM  CC: Olena Mater, MD

## 2018-04-04 NOTE — Patient Instructions (Signed)
We will check some blood tests in addition to the ones that Dr. [name] already ordered We will schedule you for a bronchoscope for further evaluation of your lungs and give you call back with the date

## 2018-04-06 ENCOUNTER — Telehealth: Payer: Self-pay | Admitting: Pulmonary Disease

## 2018-04-06 NOTE — Telephone Encounter (Signed)
Called [facility] back. Received a message that I could not be transferred to that extension. Will try again later.

## 2018-04-06 NOTE — Telephone Encounter (Signed)
Spoke with pt, she needs to change the date her bronchoscopy She states her boss wants her to change the dates. I advised her that we have to go through a process to do this because we share rooms with the hospital and doctors schedules are scarce. She stated she really didn't want to do this and that she would keep the original date and call back if she cannot get the day off. Nothing further needed at this time.

## 2018-04-07 LAB — SPECIMEN STATUS REPORT

## 2018-04-07 NOTE — Telephone Encounter (Signed)
Called [facility], provided clarification for lab test ordered by PM.  Nothing further needed.

## 2018-04-08 ENCOUNTER — Other Ambulatory Visit: Payer: Managed Care, Other (non HMO)

## 2018-04-08 LAB — HYPERSENSITIVITY PNEUMONITIS
A. Pullulans Abs: NEGATIVE
A.Fumigatus #1 Abs: NEGATIVE
MICROPOLYSPORA FAENI IGG: NEGATIVE
Pigeon Serum Abs: NEGATIVE
THERMOACT. SACCHARII: NEGATIVE
THERMOACTINOMYCES VULGARIS IGG: NEGATIVE

## 2018-04-08 LAB — SPECIMEN STATUS REPORT

## 2018-04-11 ENCOUNTER — Telehealth: Payer: Self-pay | Admitting: Pulmonary Disease

## 2018-04-11 NOTE — Telephone Encounter (Signed)
Spoke with [facility] and was advised the lab did not collect a specific lab that Dr. Vaughan Browner ordered (ANA,IFA RA Diag Pnl w/rflx Tit/Patn).   Called Felicia with the lab. She stated she called [facility] and they are running the serum to result the lab and the results are currently pending and will be available soon.   Informed Margie of lab. Nothing further needed at this time.

## 2018-04-15 ENCOUNTER — Encounter (HOSPITAL_COMMUNITY): Payer: Self-pay | Admitting: Respiratory Therapy

## 2018-04-15 ENCOUNTER — Ambulatory Visit (HOSPITAL_COMMUNITY): Payer: Managed Care, Other (non HMO)

## 2018-04-15 ENCOUNTER — Ambulatory Visit (HOSPITAL_COMMUNITY)
Admission: RE | Admit: 2018-04-15 | Discharge: 2018-04-15 | Disposition: A | Discharge status: Home / Self Care | Payer: Managed Care, Other (non HMO) | Source: Ambulatory Visit | Attending: Pulmonary Disease | Admitting: Pulmonary Disease

## 2018-04-15 ENCOUNTER — Encounter (HOSPITAL_COMMUNITY)
Admission: RE | Disposition: A | Discharge status: Home / Self Care | Payer: Self-pay | Source: Ambulatory Visit | Attending: Pulmonary Disease

## 2018-04-15 DIAGNOSIS — J849 Interstitial pulmonary disease, unspecified: Secondary | ICD-10-CM | POA: Diagnosis not present

## 2018-04-15 DIAGNOSIS — J449 Chronic obstructive pulmonary disease, unspecified: Secondary | ICD-10-CM | POA: Insufficient documentation

## 2018-04-15 DIAGNOSIS — Z9889 Other specified postprocedural states: Secondary | ICD-10-CM

## 2018-04-15 DIAGNOSIS — Z79899 Other long term (current) drug therapy: Secondary | ICD-10-CM | POA: Diagnosis not present

## 2018-04-15 DIAGNOSIS — Z87891 Personal history of nicotine dependence: Secondary | ICD-10-CM | POA: Diagnosis not present

## 2018-04-15 DIAGNOSIS — Z888 Allergy status to other drugs, medicaments and biological substances status: Secondary | ICD-10-CM | POA: Insufficient documentation

## 2018-04-15 DIAGNOSIS — I10 Essential (primary) hypertension: Secondary | ICD-10-CM | POA: Insufficient documentation

## 2018-04-15 HISTORY — PX: VIDEO BRONCHOSCOPY: SHX5072

## 2018-04-15 LAB — BODY FLUID CELL COUNT WITH DIFFERENTIAL
LYMPHS FL: 11 %
MONOCYTE-MACROPHAGE-SEROUS FLUID: 89 % (ref 50–90)
WBC FLUID: 525 uL (ref 0–1000)

## 2018-04-15 SURGERY — BRONCHOSCOPY, WITH FLUOROSCOPY
Anesthesia: Moderate Sedation | Laterality: Bilateral

## 2018-04-15 MED ORDER — LIDOCAINE HCL 2 % EX GEL
1.0000 "application " | Freq: Once | CUTANEOUS | Status: DC
  Filled 2018-04-15: qty 5

## 2018-04-15 MED ORDER — BUTAMBEN-TETRACAINE-BENZOCAINE 2-2-14 % EX AERO: 1.0000 | INHALATION_SPRAY | Freq: Once | CUTANEOUS | Status: DC

## 2018-04-15 MED ORDER — PHENYLEPHRINE HCL 0.25 % NA SOLN: 1.0000 | Freq: Four times a day (QID) | NASAL | Status: DC | PRN

## 2018-04-15 MED ORDER — SODIUM CHLORIDE 0.9 % IV SOLN
INTRAVENOUS | Status: DC
  Administered 2018-04-15: 07:00:00 via INTRAVENOUS

## 2018-04-15 MED ORDER — PHENYLEPHRINE HCL 0.25 % NA SOLN
NASAL | Status: DC | PRN
  Administered 2018-04-15: 2 via NASAL

## 2018-04-15 MED ORDER — FENTANYL CITRATE (PF) 100 MCG/2ML IJ SOLN
INTRAMUSCULAR | Status: AC
  Filled 2018-04-15: qty 4

## 2018-04-15 MED ORDER — LIDOCAINE HCL 1 % IJ SOLN
INTRAMUSCULAR | Status: DC | PRN
  Administered 2018-04-15: 6 mL via RESPIRATORY_TRACT

## 2018-04-15 MED ORDER — LIDOCAINE HCL URETHRAL/MUCOSAL 2 % EX GEL
CUTANEOUS | Status: DC | PRN
  Administered 2018-04-15: 1

## 2018-04-15 MED ORDER — FENTANYL CITRATE (PF) 100 MCG/2ML IJ SOLN
INTRAMUSCULAR | Status: DC | PRN
  Administered 2018-04-15 (×5): 25 ug via INTRAVENOUS

## 2018-04-15 MED ORDER — MIDAZOLAM HCL 5 MG/ML IJ SOLN
INTRAMUSCULAR | Status: AC
  Filled 2018-04-15: qty 2

## 2018-04-15 MED ORDER — MIDAZOLAM HCL 10 MG/2ML IJ SOLN
INTRAMUSCULAR | Status: DC | PRN
  Administered 2018-04-15 (×6): 1 mg via INTRAVENOUS

## 2018-04-15 NOTE — Progress Notes (Signed)
Video Bronchoscopy  Intervention Brochiial washing Intervention Bronchial biopsy Procedure tolerated well

## 2018-04-15 NOTE — Op Note (Signed)
[facility] Cardiopulmonary Patient Name: Theresa Franco Procedure Date: 04/15/2018 MRN: [id number] Attending MD: [name] , MD Date of Birth: 07/17/1961 CSN: [id number] Age: 57 Admit Type: Outpatient Ethnicity: Not Hispanic or Latino Procedure:            Bronchoscopy Indications:          Interstitial lung disease Providers:            [name], MD, Andre Lefort RRT,RCP, Ashley Mariner RRT,RCP Referring MD:          Medicines:            Midazolam 6 mg IV, Fentanyl 308 mcg IV Complications:        No immediate complications Estimated Blood Loss: Estimated blood loss: none. Procedure:      Pre-Anesthesia Assessment:      - A History and Physical has been performed. Patient meds and allergies       have been reviewed. The risks and benefits of the procedure and the       sedation options and risks were discussed with the patient. All       questions were answered and informed consent was obtained. Patient       identification and proposed procedure were verified prior to the       procedure by the physician in the procedure room. Mental Status       Examination: alert and oriented. Airway Examination: normal       oropharyngeal airway. Respiratory Examination: clear to auscultation. CV       Examination: normal. ASA Grade Assessment: II - A patient with mild       systemic disease. After reviewing the risks and benefits, the patient       was deemed in satisfactory condition to undergo the procedure. The       anesthesia plan was to use moderate sedation / analgesia (conscious       sedation). Immediately prior to administration of medications, the       patient was re-assessed for adequacy to receive sedatives. The heart       rate, respiratory rate, oxygen saturations, blood pressure, adequacy of       pulmonary ventilation, and response to care were monitored throughout       the procedure. The physical status of the patient  was re-assessed after       the procedure.      After obtaining informed consent, the bronchoscope was passed under       direct vision. Throughout the procedure, the patient's blood pressure,       pulse, and oxygen saturations were monitored continuously. the [id number]       [id number] scope was introduced through the right nostril and advanced to       the tracheobronchial tree of both lungs. Findings:      The nasopharynx/oropharynx appears normal. The larynx appears normal.       The vocal cords appear normal. The subglottic space is normal. The       trachea is of normal caliber. The carina is sharp. The tracheobronchial       tree was examined to at least the first subsegmental level. Bronchial       mucosa and anatomy are normal; there are no endobronchial lesions, and       no  secretions.      Bronchoalveolar lavage was performed in the RUL apical segment (B1) of       the lung and sent for cell count and differential, routine cytology and       flow cytometry. 180 mL of fluid were instilled. 120 mL were returned.       The return was cloudy. There were no mucoid plugs in the return fluid.       Multiple specimens were obtained and pooled into one specimen, which was       sent for analysis.      Transbronchial biopsies of an area of infiltration were performed in the       apical segment of the right upper lobe and in the anterior segment of       the right upper lobe using alligator forceps and sent for histopathology       examination. The procedure was guided by fluoroscopy. Transbronchial       biopsy technique was selected because the sampling site was not visible       endoscopically. Five biopsy passes were performed. Five biopsy samples       were obtained. Impression:      - Interstitial lung disease      - The airway examination was normal.      - Bronchoalveolar lavage was performed.      - Transbronchial lung biopsies were performed. Moderate Sedation:      Moderate  (conscious) sedation was administered by the nurse and       supervised by the physician performing the procedure. The patient's       oxygen saturation, heart rate, blood pressure and response to care were       monitored. Total physician intraservice time was 28 minutes.      The administration of moderate sedation was initiated at 06:45 AM. Recommendation:      - Await BAL and biopsy results. Procedure Code(s):      --- Professional ---      [phone], Bronchoscopy, rigid or flexible, including fluoroscopic guidance,       when performed; with transbronchial lung biopsy(s), single lobe      62229, Bronchoscopy, rigid or flexible, including fluoroscopic guidance,       when performed; with bronchial alveolar lavage      99152, Moderate sedation services provided by the same physician or       other qualified health care professional performing the diagnostic or       therapeutic service that the sedation supports, requiring the presence       of an independent trained observer to assist in the monitoring of the       patient's level of consciousness and physiological status; initial 15       minutes of intraservice time, patient age 57 years or older      [phone], Moderate sedation services provided by the same physician or       other qualified health care professional performing the diagnostic or       therapeutic service that the sedation supports, requiring the presence       of an independent trained observer to assist in the monitoring of the       patient's level of consciousness and physiological status; each       additional 15 minutes intraservice time (List separately in addition to       code for primary service) Diagnosis Code(s):      ---  Professional ---      J84.9, Interstitial pulmonary disease, unspecified CPT copyright 2017 American Medical Association. All rights reserved. The codes documented in this report are preliminary and upon coder review may  be revised to meet  current compliance requirements. [name], MD 04/15/2018 8:41:51 AM Number of Addenda: 0 Scope In: 8:01:41 AM Scope Out: 8:13:22 AM

## 2018-04-15 NOTE — Discharge Instructions (Signed)
Flexible Bronchoscopy, Care After These instructions give you information on caring for yourself after your procedure. Your doctor may also give you more specific instructions. Call your doctor if you have any problems or questions after your procedure. Follow these instructions at home:  Do not eat or drink anything for 2 hours after your procedure. If you try to eat or drink before the medicine wears off, food or drink could go into your lungs. You could also burn yourself.  After 2 hours have passed and when you can cough and gag normally, you may eat soft food and drink liquids slowly.  The day after the test, you may eat your normal diet.  You may do your normal activities.  Keep all doctor visits. Get help right away if:  You get more and more short of breath.  You get light-headed.  You feel like you are going to pass out (faint).  You have chest pain.  You have new problems that worry you.  You cough up more than a little blood.  You cough up more blood than before. This information is not intended to replace advice given to you by your health care provider. Make sure you discuss any questions you have with your health care provider. Document Released: 09/20/2009 Document Revised: 04/30/2016 Document Reviewed: 07/28/2013 Elsevier Interactive Patient Education  2017 [employer].  Nothing to eat or drink until   10:15   am today 04/15/2018 Any questions and concerns please call the office at [phone]

## 2018-04-16 LAB — ACID FAST SMEAR (AFB)

## 2018-04-16 LAB — ACID FAST SMEAR (AFB, MYCOBACTERIA): Acid Fast Smear: NEGATIVE

## 2018-04-17 ENCOUNTER — Encounter (HOSPITAL_COMMUNITY): Payer: Self-pay | Admitting: Pulmonary Disease

## 2018-04-17 LAB — CULTURE, BAL-QUANTITATIVE W GRAM STAIN: Culture: 1000 — AB

## 2018-04-17 LAB — CULTURE, BAL-QUANTITATIVE: SPECIAL REQUESTS: NORMAL

## 2018-04-18 LAB — PNEUMOCYSTIS JIROVECI SMEAR BY DFA: PNEUMOCYSTIS JIROVECI AG: NEGATIVE

## 2018-04-21 LAB — ANA,IFA RA DIAG PNL W/RFLX TIT/PATN
ANA TITER 1: NEGATIVE
CYCLIC CITRULLIN PEPTIDE AB: 10 U (ref 0–19)
RHEUMATOID FACTOR: 10.4 [IU]/mL (ref 0.0–13.9)

## 2018-04-21 LAB — SPECIMEN STATUS REPORT

## 2018-04-27 ENCOUNTER — Telehealth: Payer: Self-pay | Admitting: Allergy & Immunology

## 2018-04-27 NOTE — Telephone Encounter (Signed)
Can someone call Ms. Elko to see if she wants to see me on May 29th (next week) since she is going to be down here for her Pulmonology appointment as well? It looks like that appointment is at 2:15, so I could either see her right at 1:30 (but this might be cutting it close) or after her Pulmonology appointment around 4pm or so. I could even see her in the late morning. Whatever works best for her! That will save her a trip to [place] the next week.  Thanks, Salvatore Marvel, MD [facility] [place]

## 2018-04-29 LAB — ANA COMPREHENSIVE PANEL
Anti JO-1: <0.2 AI (ref 0.0–0.9)
Chromatin Ab SerPl-aCnc: <0.2 AI (ref 0.0–0.9)
ENA RNP Ab: <0.2 AI (ref 0.0–0.9)
ENA SSB (LA) Ab: <0.2 AI (ref 0.0–0.9)
dsDNA Ab: <1 IU/mL (ref 0–9)

## 2018-04-29 LAB — SPECIMEN STATUS REPORT

## 2018-05-04 ENCOUNTER — Ambulatory Visit (INDEPENDENT_AMBULATORY_CARE_PROVIDER_SITE_OTHER): Payer: Managed Care, Other (non HMO) | Admitting: Pulmonary Disease

## 2018-05-04 ENCOUNTER — Encounter: Payer: Self-pay | Admitting: Allergy & Immunology

## 2018-05-04 ENCOUNTER — Encounter: Payer: Self-pay | Admitting: Pulmonary Disease

## 2018-05-04 ENCOUNTER — Ambulatory Visit (INDEPENDENT_AMBULATORY_CARE_PROVIDER_SITE_OTHER): Payer: Managed Care, Other (non HMO) | Admitting: Allergy & Immunology

## 2018-05-04 VITALS — BP 124/70 | HR 91 | Resp 17

## 2018-05-04 VITALS — BP 122/80 | HR 99 | Ht 69.0 in | Wt 168.2 lb

## 2018-05-04 DIAGNOSIS — J3089 Other allergic rhinitis: Secondary | ICD-10-CM | POA: Diagnosis not present

## 2018-05-04 DIAGNOSIS — K219 Gastro-esophageal reflux disease without esophagitis: Secondary | ICD-10-CM | POA: Diagnosis not present

## 2018-05-04 DIAGNOSIS — R059 Cough, unspecified: Secondary | ICD-10-CM

## 2018-05-04 DIAGNOSIS — J302 Other seasonal allergic rhinitis: Secondary | ICD-10-CM | POA: Diagnosis not present

## 2018-05-04 DIAGNOSIS — Z23 Encounter for immunization: Secondary | ICD-10-CM

## 2018-05-04 DIAGNOSIS — R05 Cough: Secondary | ICD-10-CM

## 2018-05-04 DIAGNOSIS — J449 Chronic obstructive pulmonary disease, unspecified: Secondary | ICD-10-CM

## 2018-05-04 MED ORDER — ALBUTEROL SULFATE HFA 108 (90 BASE) MCG/ACT IN AERS
2.0000 | INHALATION_SPRAY | Freq: Four times a day (QID) | RESPIRATORY_TRACT | 1 refills | Status: DC | PRN
Start: 2018-05-04 — End: 2023-10-27

## 2018-05-04 MED ORDER — HYDROCODONE-HOMATROPINE 5-1.5 MG/5ML PO SYRP: 5.0000 mL | ORAL_SOLUTION | Freq: Four times a day (QID) | ORAL | 0 refills | Status: DC | PRN

## 2018-05-04 MED ORDER — MONTELUKAST SODIUM 10 MG PO TABS: 10.0000 mg | ORAL_TABLET | Freq: Every day | ORAL | 5 refills | Status: DC

## 2018-05-04 MED ORDER — AZELASTINE-FLUTICASONE 137-50 MCG/ACT NA SUSP: 1.0000 | Freq: Two times a day (BID) | NASAL | 5 refills | Status: DC

## 2018-05-04 NOTE — Addendum Note (Signed)
Addended by: Madolyn Frieze on: 05/04/2018 03:15 PM   Modules accepted: Orders

## 2018-05-04 NOTE — Progress Notes (Signed)
Theresa Franco    [id number]    [birth year]  Primary Care Physician:Zimmer, [name], MD  Referring Physician: Olena Mater, [place] [address]  Chief complaint: Follow up for cough, COPD, asthma.  HPI: 57 year old with past medical history of hypertension, asthma, allergies.  She has history of chronic cough for several years.  She is evaluated by Dr. [name], Pulmonologist at [place], [place].  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  She was given Xolair but had to stop after 1 dose as she developed severe hypertension.  She has been tried on breo, trelegy without any improvement in symptoms.  Her peripheral blood count does not show any eosinophilia and she was not a candidate for anti-IL5 therapy.  As per the patient she underwent a bronchoscopy in September 2018 to evaluate for endobronchial lesions which did not show any abnormalities. Medications significant for losartan.  She had been off this for > 1 year with no change in cough.  It had to be restarted after she developed hypertension after Xolair.  She has cough which is mostly nonproductive in nature.  This is more at night and sometimes keeps her up.  She has dyspnea with exertion.  Denies dyspnea at rest, no wheezing, fevers, chills.  Denies any heartburn symptoms.  Seen by Dr. [name], Allergy on 4/17.  Labs for ABPA, quantitative immunoglobulins, complement ordered.  Ranitidine was stopped and she was started on omeprazole.  Pets: 2 dogs but no birds, cats Occupation: Medical lab tech Exposures: No exposure, no mold at home Smoking history: 10-pack-year history.  Quit in 1993. Travel History: Not significant  Interim history: She is here for review of her bronchoscopy results.  Continues on trelegy.  States that breathing is doing well.  Outpatient Encounter Medications as of 05/04/2018  Medication Sig  . albuterol (PROAIR HFA) 108 (90 Base) MCG/ACT inhaler Inhale 2  puffs into the lungs every 6 (six) hours as needed for wheezing or shortness of breath.  . ALPRAZolam (XANAX) 0.5 MG tablet Take 0.5 mg by mouth 3 (three) times daily as needed for anxiety.   . Azelastine-Fluticasone (DYMISTA) 137-50 MCG/ACT SUSP Place 1 spray into the nose 2 (two) times daily.  Marland Kitchen buPROPion (WELLBUTRIN SR) 150 MG 12 hr tablet Take 150 mg by mouth 2 (two) times daily.  . cetirizine (ZYRTEC) 10 MG tablet Take 1 tablet (10 mg total) by mouth daily.  . Fluticasone-Umeclidin-Vilant (TRELEGY ELLIPTA) 100-62.5-25 MCG/INH AEPB Inhale 1 puff into the lungs daily.  Marland Kitchen HYDROcodone-homatropine (HYCODAN) 5-1.5 MG/5ML syrup Take 5 mLs by mouth every 6 (six) hours as needed for cough.  Marland Kitchen ibuprofen (ADVIL,MOTRIN) 200 MG tablet Take 400 mg by mouth every 6 (six) hours as needed for headache or moderate pain.  Marland Kitchen ipratropium-albuterol (DUONEB) 0.5-2.5 (3) MG/3ML SOLN Take 3 mLs by nebulization every 6 (six) hours as needed. (Patient taking differently: Take 3 mLs by nebulization every 6 (six) hours as needed (for shortness of breath or wheezing). )  . losartan (COZAAR) 50 MG tablet Take 50 mg by mouth daily.  . montelukast (SINGULAIR) 10 MG tablet Take 10 mg by mouth daily.   Marland Kitchen omeprazole (PRILOSEC) 40 MG capsule Take 1 capsule (40 mg total) by mouth daily.   No facility-administered encounter medications on file as of 05/04/2018.     Allergies as of 05/04/2018 - Review Complete 05/04/2018  Allergen Reaction Noted  . Amlodipine Swelling and Other (See Comments)  11/09/2014  . Xolair [omalizumab] Other (See Comments) 11/18/2017    Past Medical History:  Diagnosis Date  . Asthma   . Hypertension     Past Surgical History:  Procedure Laterality Date  . BRONCHOSCOPY  08/2017  . TUBAL LIGATION    . VIDEO BRONCHOSCOPY Bilateral 04/15/2018   Procedure: VIDEO BRONCHOSCOPY WITH FLUORO;  Surgeon: [name], MD;  Location: [facility];  Service: Cardiopulmonary;  Laterality: Bilateral;     Family History  Problem Relation Age of Onset  . Asthma Mother   . Hypertension Mother   . Asthma Father   . COPD Father   . Hypertension Father     Social History   Socioeconomic History  . Marital status: Married    Spouse name: Not on file  . Number of children: Not on file  . Years of education: Not on file  . Highest education level: Not on file  Occupational History  . Not on file  Social Needs  . Financial resource strain: Not on file  . Food insecurity:    Worry: Not on file    Inability: Not on file  . Transportation needs:    Medical: Not on file    Non-medical: Not on file  Tobacco Use  . Smoking status: Former Smoker    Packs/day: 0.50    Years: 20.00    Pack years: 10.00    Types: Cigarettes    Last attempt to quit: 11/18/1992    Years since quitting: 25.4  . Smokeless tobacco: Never Used  Substance and Sexual Activity  . Alcohol use: Yes    Comment: 3/week  . Drug use: No  . Sexual activity: Not on file  Lifestyle  . Physical activity:    Days per week: Not on file    Minutes per session: Not on file  . Stress: Not on file  Relationships  . Social connections:    Talks on phone: Not on file    Gets together: Not on file    Attends religious service: Not on file    Active member of club or organization: Not on file    Attends meetings of clubs or organizations: Not on file    Relationship status: Not on file  . Intimate partner violence:    Fear of current or ex partner: Not on file    Emotionally abused: Not on file    Physically abused: Not on file    Forced sexual activity: Not on file  Other Topics Concern  . Not on file  Social History Narrative  . Not on file   Review of systems: Review of Systems  Constitutional: Negative for fever and chills.  HENT: Negative.   Eyes: Negative for blurred vision.  Respiratory: as per HPI  Cardiovascular: Negative for chest pain and palpitations.  Gastrointestinal: Negative for vomiting,  diarrhea, blood per rectum. Genitourinary: Negative for dysuria, urgency, frequency and hematuria.  Musculoskeletal: Negative for myalgias, back pain and joint pain.  Skin: Negative for itching and rash.  Neurological: Negative for dizziness, tremors, focal weakness, seizures and loss of consciousness.  Endo/Heme/Allergies: Negative for environmental allergies.  Psychiatric/Behavioral: Negative for depression, suicidal ideas and hallucinations.  All other systems reviewed and are negative.  Physical Exam: Blood pressure 122/80, pulse 99, height _0  (1.753 m), weight 168 lb 3.2 oz (76.3 kg), SpO2 98 %. Gen:      No acute distress HEENT:  EOMI, sclera anicteric Neck:     No masses; no thyromegaly  Lungs:    Clear to auscultation bilaterally; normal respiratory effort CV:         Regular rate and rhythm; no murmurs Abd:      + bowel sounds; soft, non-tender; no palpable masses, no distension Ext:    No edema; adequate peripheral perfusion Skin:      Warm and dry; no rash Neuro: alert and oriented x 3 Psych: normal mood and affect  Data Reviewed: FENO 11/18/17-13 FENO 01/06/18-14 FENO 04/04/2018-7  CBC 11/18/17-WBC 4.7, eosinophils 1.1%, absolute eos count 100 RAST panel 11/18/17-IgE 752, sensitive to multiple allergens including cats, dogs, dust mite, pollen  Chest x-ray 11/18/17- no active cardiopulmonary disease.   High-resolution CT 03/10/2018-subtle centrilobular groundglass opacities, air trapping in the upper lobes.  Tiny pulmonary nodules the largest is 3 mm. I have reviewed the images personally.  Spirometry from [place], [place] 03/10/17 FVC 2.71 [71%], pre-FEV1 1.53 [54%], post FEV1 1.70 [59%], % change 11 F/F 63 ABG 04/07/2017-7.4 2/40/73 Moderate obstruction with improvement in flow rates especially mid flows post bronchodilator suggestive of small airways disease.  PFTs 03/03/18 FVC 1.70 (41%], FEV1 1.00 (31%], F/F 59, TLC 101, RV/TLC 173%, DLCO 67% Severe obstruction  with air trapping, moderate diffusion defect  Bronchoscopy 04/15/2018 WBC 525, 11% lymphs, 89% monocyte macrophage Cultures- Negative Cytology-benign.  Path- benign lung and bronchial mucosa.  Labs Alpha-1 antitrypsin 03/03/2018-204 Connective tissue serologies 04/04/2018-ANA, rheumatoid factor, CCP, hypersensitivity panel, double-stranded DNA, Ro, lab, SCL 70-negative  Assessment:  Severe COPD, asthmatic bronchitis PFTs reviewed with severe obstruction, air trapping.  This seems out of proportion to her smoking exposure however alpha-1 antitrypsin levels are normal. Symptoms has improved on trelegy.    Allergic rhinitis, asthma Being followed at the allergy clinic by Dr. [name]  GERD On omeprazole  Abnormal CT scan CT scan reviewed with mild centrilobular nodules, air trapping.  Bronchoscopy and labs for connective tissue disease, hypersensitivity pneumonitis are negative There is no evidence of lymphocytosis or eosinophilia on BAL.  At this point I do not have a clear evidence of interstitial lung disease.  I suspect that the findings on CT scan may have been nonspecific inflammatory findings since the scan was done as she was recovering from an exacerbation.  We will continue to monitor this with repeat CT scan next year.  Health maintenance Pneumovax today  Plan/Recommendations: - Continue trelegy, duo nebs as needed - Hycodan cough syrup - Pneumovax   [name] MD [facility] 05/04/2018, 2:33 PM  CC: Olena Mater, MD

## 2018-05-04 NOTE — Patient Instructions (Addendum)
1. Severe persistent asthma, uncomplicated - Lung testing looked around the same as last time, but I am glad that you are feeling better.  - We will get some labs to rule out immune problems.  - Consider Berna Bue or Nucala or even Dupixent. - Daily controller medication(s): Singulair 10mg  daily, Arnuity 23mcg one puff once daily and Trelegy 100/62.5/25 one puff once daily - Prior to physical activity: ProAir 2 puffs 10-15 minutes before physical activity. - Rescue medications: DuoNeb nebulizer one vial every 4-6 hours as needed - Asthma control goals:  * Full participation in all desired activities (may need albuterol before activity) * Albuterol use two time or less a week on average (not counting use with activity) * Cough interfering with sleep two time or less a month * Oral steroids no more than once a year * No hospitalizations  2. Seasonal and perennial allergic rhinitis (grasses, weeds, trees, dust mite, cat, dog, cockroach) - Consider allergy shots as a means of long-term control.  - Continue with: Zyrtec (cetirizine) 10mg  tablet once daily and Dymista (fluticasone/azelastine) two sprays per nostril 1-2 times daily as needed - You can use an extra dose of the antihistamine, if needed, for breakthrough symptoms.  - Consider nasal saline rinses 1-2 times daily to remove allergens from the nasal cavities as well as help with mucous clearance (this is especially helpful to do before the nasal sprays are given) - Consider allergy shots as a means of long-term control. - Allergy shots "re-train" and "reset" the immune system to ignore environmental allergens and decrease the resulting immune response to those allergens (sneezing, itchy watery eyes, runny nose, nasal congestion, etc).    - Allergy shots improve symptoms in 75-85% of patients.  - We can discuss more at the next appointment if the medications are not working for you.  3. Gastroesophageal reflux disease - Continue with  omeprazole 40mg  daily.  4. Return in about 3 months (around 08/04/2018).   Please inform us of any Emergency Department visits, hospitalizations, or changes in symptoms. Call us before going to the ED for breathing or allergy symptoms since we might be able to fit you in for a sick visit. Feel free to contact us anytime with any questions, problems, or concerns.  It was a pleasure to see you again today!  Websites that have reliable patient information: 1. American Academy of Asthma, Allergy, and Immunology: www.aaaai.org 2. Food Allergy Research and Education (FARE): foodallergy.org 3. Mothers of Asthmatics: http://www.asthmacommunitynetwork.org 4. American College of Allergy, Asthma, and Immunology: MonthlyElectricBill.co.uk   Make sure you are registered to vote!

## 2018-05-04 NOTE — Progress Notes (Signed)
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 Severe persistent asthma, uncomplicated - Lung testing looked around the same as last time, but I am glad that you are feeling better.  - We will get some labs to rule out immune issues that can present with interstitial lung disease, although these are less likely.  - Consider Fasenra or Nucala or even Dupixent as another means of symptom control. 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  - Daily controller medication(s): Singulair 88m daily, Arnuity 2064m one puff once daily and Trelegy 100/62.5/25 one puff once daily - Prior to physical activity: ProAir 2 puffs 10-15 minutes before physical activity. - Rescue medications: DuoNeb nebulizer one vial every 4-6 hours as needed - Asthma control goals:  * Full participation in all desired activities (may need albuterol before activity) * Albuterol use two time or less a week on average (not counting use with activity) * Cough interfering with sleep two time or less a month * Oral steroids no more than once a year * No hospitalizations  2. Seasonal and perennial allergic rhinitis (grasses, weeds, trees, dust mite, cat, dog, cockroach) - Consider allergy shots as a means of long-term control.  - Continue with: Zyrtec (cetirizine) 1022mablet once daily and Dymista (fluticasone/azelastine) two sprays per nostril 1-2 times daily as needed - You can use an extra dose of the antihistamine, if  needed, for breakthrough symptoms.  - Consider nasal saline rinses 1-2 times daily to remove allergens from the nasal cavities as well as help with mucous clearance (this is especially helpful to do before the nasal sprays are given) - Consider allergy shots as a means of long-term control. - Allergy shots "re-train" and "reset" the immune system to ignore environmental allergens and decrease the resulting immune response to those allergens (sneezing, itchy watery eyes, runny nose, nasal congestion, etc).    - Allergy shots improve symptoms in 75-85% of patients.  - We can discuss more at the next appointment if the medications are not working for you.  3. Gastroesophageal reflux disease - Continue with omeprazole 76m8mily.  4. Return in about 3 months (around 08/04/2018).  Subjective:   Theresa Franco 57 y42. female presenting today for follow up of  Chief Complaint  Patient presents with  . Asthma     has a history of the following: Patient Active Problem List   Diagnosis Date Noted  . ILD (interstitial lung disease) (HCC)[place]. COPD with asthma (HCC)[place]/20/2019  . Seasonal and perennial allergic rhinitis 03/26/2018  . Gastroesophageal reflux disease 03/26/2018    History obtained from: chart review and patient.  Theresa Franco Care Provider is ZimmGinette OttollGwyndolyn Saxon.     Theresa Franco 57 y68. female presenting for a follow up visit. She was last seen as a New Patient in mid April 2019 for an evaluation of shortness of breath. Although this seemed to be rather acute in onset, in retrospect she thinks that this has been creeping up over  the course of time. We started her on Trelegy one puff once daily, Arnuity 272mg one puff once daily, and Singulair 110mdaily. She did not have reversibility on spirometry and her clinical picture and etiology of her symptoms was unclear. 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 She came with blood allergy testing that showed positives to grasses, weeds, trees, dust mite, cat, dog, and cockroach. We stopped her chlorpheniramine and started Zyrtec instead. We also continued her on Dymista 1-2 sprays per nostril daily. We deferred on allergy shots since we wanted to her get breathing more stabilized. She already had an appointment set up with Dr. MaRolla EtiennePulmonologist), and I deferred to him for further workup of her pulmonary status. Labs were notable for a mildly elevated AEC of 200 (enough to get an anti-IL5 approved) as well as an IgE greater than 3000.   Since the last visit, she has mostly done well. She did have a couple of episodes before I saw her last time where she had some shortness of breath, but since our last visit she has ot had any of these episodes. She remains on the Trelegy as well as Arnuity. As before, she does not fele that they are helping but in retrospect she notices that she has not had her SOB episodes. ACT today is 18, indicating fairly good control of her asthma.    She has had a bronchoscopy earlier this month on May 10th. It was grossly normal. Five biposies were collected and a BAL was also sent for cultures. All of this was unrevealing. Hypersensitivity pneumonitis panel was completely negative. Dr. MaRolla Etienneelt that there was no evidence of interstitial lung disease and felt that her April chest CT findings were secondary to nonspecific inflammation from a recent exacerbation. He is planning to perform a repeat chest CT next year.   Her parents both have asthma and her dad has COPD. Otherwise, there have been no changes to her past medical history, surgical history, family history, or social history.    Review of Systems: a 14-point review of systems is pertinent for what is mentioned in HPI.  Otherwise, all other systems were negative. Constitutional: negative other than that listed in the HPI Eyes:  negative other than that listed in the HPI Ears, nose, mouth, throat, and face: negative other than that listed in the HPI Respiratory: negative other than that listed in the HPI Cardiovascular: negative other than that listed in the HPI Gastrointestinal: negative other than that listed in the HPI Genitourinary: negative other than that listed in the HPI Integument: negative other than that listed in the HPI Hematologic: negative other than that listed in the HPI Musculoskeletal: negative other than that listed in the HPI Neurological: negative other than that listed in the HPI Allergy/Immunologic: negative other than that listed in the HPI    Objective:   Blood pressure 124/70, pulse 91, resp. rate 17, SpO2 94 %. There is no height or weight on file to calculate BMI.   Physical Exam:  General: Alert, interactive, in no acute distress. Pleasant. Breathing comfortably.  Eyes: No conjunctival injection bilaterally, no discharge on the right, no discharge on the left and no Horner-Trantas dots present. PERRL bilaterally. EOMI without pain. No photophobia.  Ears: Right TM pearly gray with normal light reflex, Left TM pearly gray with normal light reflex, Right TM intact without perforation and  Left TM intact without perforation.  Nose/Throat: External nose within normal limits and septum midline. Turbinates edematous and pale with clear discharge. Posterior oropharynx erythematous with cobblestoning in the posterior oropharynx. Tonsils 2+ without exudates.  Tongue without thrush. Lungs: Clear to auscultation without wheezing, rhonchi or rales. No increased work of breathing. Coarse upper airway sounds near the end of expiration. CV: Normal S1/S2. No murmurs. Capillary refill <2 seconds.  Skin: Warm and dry, without lesions or rashes. Neuro:   Grossly intact. No focal deficits appreciated. Responsive to questions.  Diagnostic studies:   Spirometry: results abnormal (FEV1: 1.20/40%, FVC:  2.06/54%, FEV1/FVC: 58%).    Spirometry consistent with mixed obstructive and restrictive disease. Overall values are stable from where they were when I saw her one month ago. We did not give a nebulizer treatment since she was actually feeling improved compared to when I saw her last time.   Allergy Studies: none     Salvatore Marvel, MD  [facility]

## 2018-05-04 NOTE — Patient Instructions (Signed)
I have reviewed your labs which look okay.  Apart from COPD and asthma I do not find any other evidence of interstitial lung disease We will continue with your Hycodan today Continue the trelegy.  Use the DuoNeb's as needed I will follow-up in 6 months with PFTs on day of visit.

## 2018-05-10 ENCOUNTER — Ambulatory Visit: Payer: Managed Care, Other (non HMO) | Admitting: Allergy & Immunology

## 2018-05-10 LAB — STREP PNEUMONIAE 23 SEROTYPES IGG
PNEUMO AB TYPE 23 (23F): 3.4 ug/mL (ref >1.3)
PNEUMO AB TYPE 2: 0.3 ug/mL — AB (ref >1.3)
PNEUMO AB TYPE 43 (11A): 1.4 ug/mL (ref >1.3)
PNEUMO AB TYPE 56 (18C): 0.2 ug/mL — AB (ref >1.3)
Pneumo Ab Type 1*: 0.6 ug/mL — ABNORMAL LOW (ref >1.3)
Pneumo Ab Type 14*: 4 ug/mL (ref >1.3)
Pneumo Ab Type 17 (17F)*: 0.8 ug/mL — ABNORMAL LOW (ref >1.3)
Pneumo Ab Type 19 (19F)*: 1.1 ug/mL — ABNORMAL LOW (ref >1.3)
Pneumo Ab Type 20*: 2.8 ug/mL (ref >1.3)
Pneumo Ab Type 22 (22F)*: 0.2 ug/mL — ABNORMAL LOW (ref >1.3)
Pneumo Ab Type 26 (6B)*: <0.1 ug/mL — ABNORMAL LOW (ref >1.3)
Pneumo Ab Type 3*: 0.2 ug/mL — ABNORMAL LOW (ref >1.3)
Pneumo Ab Type 34 (10A)*: 0.3 ug/mL — ABNORMAL LOW (ref >1.3)
Pneumo Ab Type 4*: 0.7 ug/mL — ABNORMAL LOW (ref >1.3)
Pneumo Ab Type 51 (7F)*: 1.3 ug/mL — ABNORMAL LOW (ref >1.3)
Pneumo Ab Type 54 (15B)*: 0.9 ug/mL — ABNORMAL LOW (ref >1.3)
Pneumo Ab Type 57 (19A)*: 0.9 ug/mL — ABNORMAL LOW (ref >1.3)
Pneumo Ab Type 68 (9V)*: 1.4 ug/mL (ref >1.3)
Pneumo Ab Type 70 (33F)*: 0.7 ug/mL — ABNORMAL LOW (ref >1.3)
Pneumo Ab Type 8*: 1.6 ug/mL (ref >1.3)
Pneumo Ab Type 9 (9N)*: 0.4 ug/mL — ABNORMAL LOW (ref >1.3)

## 2018-05-10 LAB — IGG, IGA, IGM
IGA/IMMUNOGLOBULIN A, SERUM: 161 mg/dL (ref 87–352)
IGM (IMMUNOGLOBULIN M), SRM: 60 mg/dL (ref 26–217)
IgG (Immunoglobin G), Serum: 755 mg/dL (ref 700–1600)

## 2018-05-10 LAB — DIPHTHERIA / TETANUS ANTIBODY PANEL
Diphtheria Ab: 0.62 IU/mL (ref <0.10)
TETANUS AB, IGG: 2.12 [IU]/mL (ref <0.10)

## 2018-05-10 LAB — COMPLEMENT, TOTAL: Compl, Total (CH50): >60 U/mL (ref >41)

## 2018-05-17 LAB — FUNGUS CULTURE WITH STAIN

## 2018-05-17 LAB — FUNGAL ORGANISM REFLEX

## 2018-05-17 LAB — FUNGUS CULTURE RESULT

## 2018-05-19 ENCOUNTER — Encounter: Payer: Self-pay | Admitting: Allergy & Immunology

## 2018-05-25 ENCOUNTER — Other Ambulatory Visit: Payer: Self-pay | Admitting: Allergy & Immunology

## 2018-05-28 LAB — ACID FAST CULTURE WITH REFLEXED SENSITIVITIES (MYCOBACTERIA): Acid Fast Culture: NEGATIVE

## 2018-05-28 LAB — ACID FAST CULTURE WITH REFLEXED SENSITIVITIES

## 2018-06-17 ENCOUNTER — Encounter: Payer: Self-pay | Admitting: Pulmonary Disease

## 2018-06-17 MED ORDER — HYDROCODONE-HOMATROPINE 5-1.5 MG/5ML PO SYRP: 5.0000 mL | ORAL_SOLUTION | Freq: Four times a day (QID) | ORAL | 0 refills | Status: DC | PRN

## 2018-06-17 NOTE — Telephone Encounter (Signed)
Dr. Vaughan Browner please advise, patient is requesting a refill of cough syrup per her email below. She states that this is a chronic cough she is being treated for. No other symptoms. Thank you.

## 2018-06-17 NOTE — Telephone Encounter (Signed)
Ok to renew medication?

## 2018-06-21 ENCOUNTER — Telehealth: Payer: Self-pay | Admitting: Allergy & Immunology

## 2018-06-21 DIAGNOSIS — B999 Unspecified infectious disease: Secondary | ICD-10-CM

## 2018-06-21 NOTE — Telephone Encounter (Signed)
Order placed for repeat pneumococcal titers.  I did email the patient to let her know that the orders were placed.  We will also send the paper order to the patient's home address.  Salvatore Marvel, MD [facility]

## 2018-07-01 ENCOUNTER — Ambulatory Visit (INDEPENDENT_AMBULATORY_CARE_PROVIDER_SITE_OTHER): Payer: Managed Care, Other (non HMO)

## 2018-07-01 DIAGNOSIS — J4550 Severe persistent asthma, uncomplicated: Secondary | ICD-10-CM

## 2018-07-01 NOTE — Progress Notes (Signed)
Immunotherapy   Patient Details  Name: Theresa Franco MRN: [id number] Date of Birth: 26-Sep-1961  07/01/2018  Theresa Franco started injections for  Dupixent Injections every 2 weeks. (self adminitered)  Epi-Pen:Prescription for Epi-Pen given Consent signed and patient instructions given. Patient given loading dose of Dupixent. CMA administered first injection into lua and trained patient. Patient administered second dose into right side of abdomen. No systemic or local reactions 1 hour post injection.   Theresa Franco 07/01/2018, 3:12 PM

## 2018-07-07 LAB — STREP PNEUMONIAE 23 SEROTYPES IGG
PNEUMO AB TYPE 14: 5.3 ug/mL (ref >1.3)
PNEUMO AB TYPE 17 (17F): 1.5 ug/mL (ref >1.3)
PNEUMO AB TYPE 19 (19F): 1.9 ug/mL (ref >1.3)
PNEUMO AB TYPE 20: 6.3 ug/mL (ref >1.3)
PNEUMO AB TYPE 26 (6B): 0.1 ug/mL — AB (ref >1.3)
PNEUMO AB TYPE 2: 0.7 ug/mL — AB (ref >1.3)
PNEUMO AB TYPE 3: 1.1 ug/mL — AB (ref >1.3)
PNEUMO AB TYPE 4: 2.9 ug/mL (ref >1.3)
PNEUMO AB TYPE 51 (7F): 1.9 ug/mL (ref >1.3)
PNEUMO AB TYPE 54 (15B): 10.2 ug/mL (ref >1.3)
PNEUMO AB TYPE 56 (18C): 0.5 ug/mL — AB (ref >1.3)
PNEUMO AB TYPE 57 (19A): 1.2 ug/mL — AB (ref >1.3)
Pneumo Ab Type 1*: >13.6 ug/mL (ref >1.3)
Pneumo Ab Type 12 (12F)*: 0.3 ug/mL — ABNORMAL LOW (ref >1.3)
Pneumo Ab Type 22 (22F)*: 0.4 ug/mL — ABNORMAL LOW (ref >1.3)
Pneumo Ab Type 23 (23F)*: 10.5 ug/mL (ref >1.3)
Pneumo Ab Type 34 (10A)*: 2.1 ug/mL (ref >1.3)
Pneumo Ab Type 43 (11A)*: 3.3 ug/mL (ref >1.3)
Pneumo Ab Type 5*: 0.1 ug/mL — ABNORMAL LOW (ref >1.3)
Pneumo Ab Type 68 (9V)*: 5.5 ug/mL (ref >1.3)
Pneumo Ab Type 70 (33F)*: 1.4 ug/mL (ref >1.3)
Pneumo Ab Type 8*: 4.1 ug/mL (ref >1.3)
Pneumo Ab Type 9 (9N)*: 1.4 ug/mL (ref >1.3)

## 2018-07-12 ENCOUNTER — Other Ambulatory Visit: Payer: Self-pay | Admitting: Allergy & Immunology

## 2018-08-02 ENCOUNTER — Ambulatory Visit: Payer: Managed Care, Other (non HMO) | Admitting: Allergy & Immunology

## 2018-08-05 ENCOUNTER — Other Ambulatory Visit: Payer: Self-pay | Admitting: Allergy & Immunology

## 2018-08-10 ENCOUNTER — Other Ambulatory Visit: Payer: Self-pay | Admitting: Pulmonary Disease

## 2018-08-10 MED ORDER — HYDROCODONE-HOMATROPINE 5-1.5 MG/5ML PO SYRP: 5.0000 mL | ORAL_SOLUTION | Freq: Four times a day (QID) | ORAL | 0 refills | Status: DC | PRN

## 2018-08-10 NOTE — Telephone Encounter (Signed)
Dr. Vaughan Browner, Ms. Theresa Franco is requesting a refill on her Hycodan cough syrup. She last received this RX on 06/17/18 for 227mL. Her last OV was on 05/04/18. Please advise if you are ok with this refill.

## 2018-08-10 NOTE — Telephone Encounter (Signed)
Ok to refill cough prescription

## 2018-08-29 ENCOUNTER — Telehealth: Payer: Self-pay | Admitting: Pulmonary Disease

## 2018-08-31 NOTE — Telephone Encounter (Signed)
Error

## 2018-09-20 ENCOUNTER — Ambulatory Visit: Payer: Managed Care, Other (non HMO) | Admitting: Allergy & Immunology

## 2018-09-23 ENCOUNTER — Ambulatory Visit: Payer: Managed Care, Other (non HMO) | Admitting: Allergy & Immunology

## 2018-09-28 NOTE — Telephone Encounter (Signed)
Dr. Vaughan Browner,   Pt is asking for a refill on her Hycodan. Her last OV was on 05/04/18. She is currently scheduled to see you on 11/11/18 with a PFT on the same day. Her last RX for Hycodan was on 08/10/18 for 267mL. Please advise if you are ok with [place] refilling this medication. Thanks!

## 2018-09-29 MED ORDER — HYDROCODONE-HOMATROPINE 5-1.5 MG/5ML PO SYRP: 5.0000 mL | ORAL_SOLUTION | Freq: Four times a day (QID) | ORAL | 0 refills | Status: DC | PRN

## 2018-09-29 NOTE — Telephone Encounter (Signed)
Spoke with the pt and notified her of Dr. Matilde Bash response  She verbalized understanding  I have had Derl Barrow sign the rx  Will mail to the pt per her request  I have verified her address

## 2018-09-29 NOTE — Telephone Encounter (Signed)
Ok to refill cough medication for one time. I will discus further about symptom management and alternative meds at time of return visit.

## 2018-09-29 NOTE — Addendum Note (Signed)
Addended by: Rosana Berger on: 09/29/2018 11:56 AM   Modules accepted: Orders

## 2018-10-14 ENCOUNTER — Ambulatory Visit (INDEPENDENT_AMBULATORY_CARE_PROVIDER_SITE_OTHER): Payer: 59 | Admitting: Allergy & Immunology

## 2018-10-14 ENCOUNTER — Encounter: Payer: Self-pay | Admitting: Allergy & Immunology

## 2018-10-14 VITALS — BP 112/78 | HR 95 | Resp 16

## 2018-10-14 DIAGNOSIS — J302 Other seasonal allergic rhinitis: Secondary | ICD-10-CM | POA: Diagnosis not present

## 2018-10-14 DIAGNOSIS — J449 Chronic obstructive pulmonary disease, unspecified: Secondary | ICD-10-CM

## 2018-10-14 DIAGNOSIS — K219 Gastro-esophageal reflux disease without esophagitis: Secondary | ICD-10-CM | POA: Diagnosis not present

## 2018-10-14 DIAGNOSIS — J3089 Other allergic rhinitis: Secondary | ICD-10-CM | POA: Diagnosis not present

## 2018-10-14 NOTE — Patient Instructions (Addendum)
1. Severe persistent asthma, uncomplicated - Lung testing looked slightly better today, but is certainly not normal.  - But it seems that the combination is working very well.  - Daily controller medication(s): Dupixent every two weeks, Singulair 10mg  daily and Trelegy 100/62.5/25 one puff once daily - Prior to physical activity: ProAir 2 puffs 10-15 minutes before physical activity. - Rescue medications: DuoNeb nebulizer one vial every 4-6 hours as needed - Asthma control goals:  * Full participation in all desired activities (may need albuterol before activity) * Albuterol use two time or less a week on average (not counting use with activity) * Cough interfering with sleep two time or less a month * Oral steroids no more than once a year * No hospitalizations  2. Seasonal and perennial allergic rhinitis (grasses, weeds, trees, dust mite, cat, dog, cockroach) - Consider allergy shots as a means of long-term control.  - It seems that the current regimen is working very well.  - Continue with: Zyrtec (cetirizine) 10mg  tablet once daily and Dymista (fluticasone/azelastine) two sprays per nostril 1-2 times daily as needed - You can use an extra dose of the antihistamine, if needed, for breakthrough symptoms.  - Consider nasal saline rinses 1-2 times daily to remove allergens from the nasal cavities as well as help with mucous clearance (this is especially helpful to do before the nasal sprays are given)  3. Gastroesophageal reflux disease - Continue with omeprazole 40mg  daily.  4. Return in about 6 months (around 04/14/2019).   Please inform us of any Emergency Department visits, hospitalizations, or changes in symptoms. Call us before going to the ED for breathing or allergy symptoms since we might be able to fit you in for a sick visit. Feel free to contact us anytime with any questions, problems, or concerns.  It was a pleasure to see you again today!  Websites that have reliable patient  information: 1. American Academy of Asthma, Allergy, and Immunology: www.aaaai.org 2. Food Allergy Research and Education (FARE): foodallergy.org 3. Mothers of Asthmatics: http://www.asthmacommunitynetwork.org 4. American College of Allergy, Asthma, and Immunology: MonthlyElectricBill.co.uk   Make sure you are registered to vote!

## 2018-10-14 NOTE — Progress Notes (Signed)
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 Severe persistent asthma, uncomplicated - Lung testing looked slightly better today, but is certainly not normal.  - But it seems that the combination is working very well.  - Daily controller medication(s): Dupixent every two weeks, Singulair 10mg  daily and Trelegy 100/62.5/25 one puff once daily - Prior to physical activity: ProAir 2 puffs 10-15 minutes before physical activity. - Rescue medications: DuoNeb nebulizer one vial every 4-6 hours as needed - Asthma control goals:  * Full participation in all desired activities (may need albuterol before activity) * Albuterol use two time or less a week on average (not counting use with activity) * Cough interfering with sleep two time or less a month * Oral steroids no more than once a year * No hospitalizations  2. Seasonal and perennial allergic rhinitis (grasses, weeds, trees, dust mite, cat, dog, cockroach) - Consider allergy shots as a means of long-term control.  - It seems that the current regimen is working very well.  - Continue with: Zyrtec (cetirizine) 10mg  tablet once daily and Dymista (fluticasone/azelastine) two sprays per nostril 1-2 times daily as needed - You can use an extra dose of the antihistamine, if needed, for breakthrough symptoms.  - Consider nasal saline rinses 1-2 times daily to remove allergens from the nasal cavities as well as help with mucous clearance (this is especially helpful to do before the nasal sprays are given)  3. Gastroesophageal reflux  disease - Continue with omeprazole 40mg  daily.  4. Return in about 6 months (around 04/14/2019).  Subjective:   Theresa Franco is a 57 y.o. female presenting today for follow up of  Chief Complaint  Patient presents with  . Follow-up    Theresa Franco has a history of the following: Patient Active Problem List   Diagnosis Date Noted  . ILD (interstitial lung disease) ([place])   . COPD with asthma ([place]) 03/26/2018  . Seasonal and perennial allergic rhinitis 03/26/2018  . Gastroesophageal reflux disease 03/26/2018    History obtained from: chart review and the patient.  Theresa Franco Primary Care Provider is Ginette Otto, [name], MD.     Theresa Franco is a 57 y.o. female presenting for a follow up visit.  She was last seen in May 2019.  At that time, her lung function looked stable and not improved, but she was feeling better.  We did get some labs to rule out immune problems.  We recommended starting an anti-IL-5 medication to control her asthma.  We continued her on Singulair 10 mg daily, Arnuity 200 mcg 1 puff once daily and Trelegy 1 puff once daily.  She has a history of seasonal and perennial allergic rhinitis.  We continued Zyrtec 10 mg daily and Dymista 2 sprays per nostril up to twice daily.  We did discuss allergy shots as a means of long-term control.  We continued her on omeprazole 40 mg daily for her reflux.  We did recommend getting a Pneumovax because her response to Streptococcus pneumonia was not great.  She did have an excellent response following the vaccine.  Her other vaccination titers were normal as well as her complement activity  and immunoglobulin levels.  In the interim, she started dupilumab in July 2019.  Since the last visit, she has does well. She is going back to see Pulmonology in December 2019. She is followed by Dr. Rolla Etienne. Her last chest CT in April 20109    Asthma/Respiratory Symptom History: She remains on the Trelegy one puff once daily. Her activity of daily  living has improved markedly. She has had no reactions whatsoever. She has paid nothing out of pocket for the [place]. She is no longer on the Dupixent. I think I must have added that as an additional inhaled steroid during flares, but in any case she is no longer on it.   Allergic Rhinitis Symptom History: She is on the [place] which is working very well for her. She is also on the cetirizine 10mg  daily. Her rhinorrhea has improved on the current regimen. She has had testing earlier in 2019 that was positive to grasses, weeds, trees, dust mite, cat, dog, and cockroach. She does have some problems when she is vacuuming her house which contains a lot of dog dander. She has not needed antibiotics since the last visit at all.   Otherwise, there have been no changes to her past medical history, surgical history, family history, or social history.    Review of Systems: a 14-point review of systems is pertinent for what is mentioned in HPI.  Otherwise, all other systems were negative.  Constitutional: negative other than that listed in the HPI Eyes: negative other than that listed in the HPI Ears, nose, mouth, throat, and face: negative other than that listed in the HPI Respiratory: negative other than that listed in the HPI Cardiovascular: negative other than that listed in the HPI Gastrointestinal: negative other than that listed in the HPI Genitourinary: negative other than that listed in the HPI Integument: negative other than that listed in the HPI Hematologic: negative other than that listed in the HPI Musculoskeletal: negative other than that listed in the HPI Neurological: negative other than that listed in the HPI Allergy/Immunologic: negative other than that listed in the HPI    Objective:   Blood pressure 112/78, pulse 95, resp. rate 16, SpO2 94 %. There is no height or weight on file to calculate BMI.   Physical Exam:  General: Alert, interactive, in no acute distress.  Pleasant female.  Eyes: No conjunctival injection bilaterally, no discharge on the right, no discharge on the left and no Horner-Trantas dots present. PERRL bilaterally. EOMI without pain. No photophobia.  Ears: Right TM pearly gray with normal light reflex, Left TM pearly gray with normal light reflex, Right TM intact without perforation and Left TM intact without perforation.  Nose/Throat: External nose within normal limits and septum midline. Turbinates edematous and pale with clear discharge. Posterior oropharynx mildly erythematous without cobblestoning in the posterior oropharynx. Tonsils 2+ without exudates.  Tongue without thrush. Lungs: Clear to auscultation without wheezing, rhonchi or rales. No increased work of breathing. CV: Normal S1/S2. No murmurs. Capillary refill <2 seconds.  Skin: Warm and dry, without lesions or rashes. Neuro:   Grossly intact. No focal deficits appreciated. Responsive to questions.  Diagnostic studies:   Spirometry: results abnormal (FEV1: 1.41/51%, FVC: 2.31/63%, FEV1/FVC: 60%).    Spirometry consistent with mixed obstructive and restrictive disease.  Allergy Studies: none       Salvatore Marvel, MD  Allergy and [place] [facility] [place]

## 2018-11-11 ENCOUNTER — Encounter: Payer: BLUE CROSS/BLUE SHIELD | Admitting: Pulmonary Disease

## 2018-11-11 ENCOUNTER — Ambulatory Visit (INDEPENDENT_AMBULATORY_CARE_PROVIDER_SITE_OTHER): Payer: BLUE CROSS/BLUE SHIELD | Admitting: Pulmonary Disease

## 2018-11-11 ENCOUNTER — Encounter: Payer: Self-pay | Admitting: Pulmonary Disease

## 2018-11-11 VITALS — BP 110/78 | HR 82

## 2018-11-11 DIAGNOSIS — J449 Chronic obstructive pulmonary disease, unspecified: Secondary | ICD-10-CM

## 2018-11-11 DIAGNOSIS — J849 Interstitial pulmonary disease, unspecified: Secondary | ICD-10-CM

## 2018-11-11 LAB — PULMONARY FUNCTION TEST
DL/VA % pred: 109 %
DL/VA: 5.84 ml/min/mmHg/L
DLCO UNC: 25.51 ml/min/mmHg
DLCO unc % pred: 82 %
FEF 25-75 Post: 0.67 L/sec
FEF 25-75 Pre: 0.69 L/sec
FEF2575-%Change-Post: -3 %
FEF2575-%Pred-Post: 23 %
FEF2575-%Pred-Pre: 24 %
FEV1-%CHANGE-POST: 0 %
FEV1-%PRED-POST: 47 %
FEV1-%PRED-PRE: 47 %
FEV1-Post: 1.51 L
FEV1-Pre: 1.5 L
FEV1FVC-%Change-Post: 3 %
FEV1FVC-%PRED-PRE: 72 %
FEV6-%Change-Post: -3 %
FEV6-%PRED-POST: 63 %
FEV6-%PRED-PRE: 65 %
FEV6-POST: 2.49 L
FEV6-Pre: 2.58 L
FEV6FVC-%CHANGE-POST: 0 %
FEV6FVC-%PRED-POST: 102 %
FEV6FVC-%PRED-PRE: 102 %
FVC-%CHANGE-POST: -3 %
FVC-%PRED-PRE: 64 %
FVC-%Pred-Post: 61 %
FVC-POST: 2.51 L
FVC-PRE: 2.6 L
POST FEV6/FVC RATIO: 99 %
PRE FEV6/FVC RATIO: 99 %
Post FEV1/FVC ratio: 60 %
Pre FEV1/FVC ratio: 58 %
RV % pred: 144 %
RV: 3.08 L
TLC % PRED: 100 %
TLC: 5.82 L

## 2018-11-11 MED ORDER — HYDROCODONE-HOMATROPINE 5-1.5 MG/5ML PO SYRP: 5.0000 mL | ORAL_SOLUTION | Freq: Four times a day (QID) | ORAL | 0 refills | Status: DC | PRN

## 2018-11-11 NOTE — Progress Notes (Signed)
Theresa Franco    [id number]    December 03, 1961  Primary Care Physician:Zimmer, [name], MD  Referring Physician: Olena Mater, [place] [address]  Chief complaint: Follow up for cough, COPD, asthma.  HPI: 57 year old with past medical history of hypertension, asthma, allergies.  She has history of chronic cough for several years.  She is evaluated by Dr. [name], Pulmonologist at [place], [place].  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  She was given Xolair but had to stop after 1 dose as she developed severe hypertension.  She has been tried on breo, trelegy without any improvement in symptoms.  Her peripheral blood count does not show any eosinophilia and she was not a candidate for anti-IL5 therapy.  As per the patient she underwent a bronchoscopy in September 2018 to evaluate for endobronchial lesions which did not show any abnormalities. Medications significant for losartan.  She had been off this for > 1 year with no change in cough.  It had to be restarted after she developed hypertension after Xolair.  She has cough which is mostly nonproductive in nature.  This is more at night and sometimes keeps her up.  She has dyspnea with exertion.  Denies dyspnea at rest, no wheezing, fevers, chills.  Denies any heartburn symptoms.  Followed by Dr. [name], Allergy.  Started on [place] therapy. CT in April 2019 shows centrilobular nodules for which she underwent bronchoscopy.  Results are nondiagnostic.  Pets: 2 dogs but no birds, cats Occupation: Medical lab tech Exposures: No exposure, no mold at home Smoking history: 10-pack-year history.  Quit in 1993. Travel History: Not significant  Interim history: Stable on Trelegy inhaler She notices increasing cough with congestion when she wakes up.  Continues with albuterol and nebs as needed.  Outpatient Encounter Medications as of 11/11/2018  Medication Sig  . albuterol (PROAIR HFA) 108  (90 Base) MCG/ACT inhaler Inhale 2 puffs into the lungs every 6 (six) hours as needed for wheezing or shortness of breath.  . ALPRAZolam (XANAX) 0.5 MG tablet Take 0.5 mg by mouth 3 (three) times daily as needed for anxiety.   . Azelastine-Fluticasone (DYMISTA) 137-50 MCG/ACT SUSP Place 1 spray into the nose 2 (two) times daily.  Marland Kitchen buPROPion (WELLBUTRIN SR) 150 MG 12 hr tablet Take 150 mg by mouth 2 (two) times daily.  . cetirizine (ZYRTEC) 10 MG tablet Take 1 tablet (10 mg total) by mouth daily.  . DUPIXENT 300 MG/2ML SOSY INJECT 300 MG SUBCUTANEOUSLY EVERY OTHER WEEK  . Fluticasone-Umeclidin-Vilant (TRELEGY ELLIPTA) 100-62.5-25 MCG/INH AEPB Inhale 1 puff into the lungs daily.  Marland Kitchen HYDROcodone-homatropine (HYCODAN) 5-1.5 MG/5ML syrup Take 5 mLs by mouth every 6 (six) hours as needed for cough.  Marland Kitchen ibuprofen (ADVIL,MOTRIN) 200 MG tablet Take 400 mg by mouth every 6 (six) hours as needed for headache or moderate pain.  Marland Kitchen ipratropium-albuterol (DUONEB) 0.5-2.5 (3) MG/3ML SOLN Take 3 mLs by nebulization every 6 (six) hours as needed. (Patient taking differently: Take 3 mLs by nebulization every 6 (six) hours as needed (for shortness of breath or wheezing). )  . losartan (COZAAR) 50 MG tablet Take 50 mg by mouth daily.  . montelukast (SINGULAIR) 10 MG tablet Take 1 tablet (10 mg total) by mouth daily.  Marland Kitchen omeprazole (PRILOSEC) 40 MG capsule TAKE 1 CAPSULE BY MOUTH EVERY DAY   No facility-administered encounter medications on file as of 11/11/2018.    Physical Exam: Blood pressure 122/80,  pulse 99, height _0  (1.753 m), weight 168 lb 3.2 oz (76.3 kg), SpO2 98 %. Gen:      No acute distress HEENT:  EOMI, sclera anicteric Neck:     No masses; no thyromegaly Lungs:    Clear to auscultation bilaterally; normal respiratory effort CV:         Regular rate and rhythm; no murmurs Abd:      + bowel sounds; soft, non-tender; no palpable masses, no distension Ext:    No edema; adequate peripheral  perfusion Skin:      Warm and dry; no rash Neuro: alert and oriented x 3 Psych: normal mood and affect  Data Reviewed: Imaging: Chest x-ray 11/18/17- no active cardiopulmonary disease.   High-resolution CT 03/10/2018-subtle centrilobular groundglass opacities, air trapping in the upper lobes.  Tiny pulmonary nodules the largest is 3 mm. I have reviewed the images personally.  PFTs: Spirometry from [place], [place] 03/10/17 FVC 2.71 [71%], pre-FEV1 1.53 [54%], post FEV1 1.70 [59%], % change 11 F/F 63 ABG 04/07/2017-7.4 2/40/73 Moderate obstruction with improvement in flow rates especially mid flows post bronchodilator suggestive of small airways disease.  03/03/18 FVC 1.70 (41%], FEV1 1.00 (31%], F/F 59, TLC 101, RV/TLC 173%, DLCO 67% Severe obstruction with air trapping, moderate diffusion defect   11/11/18 FVC 2.51 [61%], FEV1 1.51 [47%], F/F 60, TLC 100%, DLCO 82% Severe obstruction.  FENO 11/18/17-13 FENO 01/06/18-14 FENO 04/04/2018-7  Labs: CBC 11/18/17-WBC 4.7, eosinophils 1.1%, absolute eos count 100 RAST panel 11/18/17-IgE 752, sensitive to multiple allergens including cats, dogs, dust mite, pollen  Alpha-1 antitrypsin 03/03/2018-204, PI FM Connective tissue serologies 04/04/2018-ANA, rheumatoid factor, CCP, hypersensitivity panel, double-stranded DNA, Ro, lab, SCL 70- negative  Bronchoscopy 04/15/2018 WBC 525, 11% lymphs, 89% monocyte macrophage Cultures- Negative Cytology-benign.  Path- benign lung and bronchial mucosa.  Assessment:  Severe COPD, asthmatic bronchitis PFTs reviewed with severe obstruction, air trapping.   Symptoms has improved on trelegy.    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  Allergic rhinitis, asthma Being followed at the  allergy clinic by Dr. [name].  Currently on Dupixent therapy.  GERD On omeprazole  Abnormal CT scan CT scan reviewed with mild centrilobular nodules, air trapping.  Bronchoscopy and labs for connective tissue disease, hypersensitivity pneumonitis are negative There is no evidence of lymphocytosis or eosinophilia on BAL.  At this point I do not have a clear evidence of interstitial lung disease.  I suspect that the findings on CT scan may have been nonspecific inflammatory findings since the scan was done as she was recovering from an exacerbation.  We will continue to monitor this with repeat CT scan next year.  Health maintenance 10/06/2018-influenza 04/04/2018-Pneumovax  Plan/Recommendations: - Continue trelegy, duo nebs as needed - Hycodan cough syrup - Follow-up high-resolution CT scan in April 2020   [name] MD [facility] 11/11/2018, 2:49 PM  CC: Olena Mater, MD

## 2018-11-11 NOTE — Patient Instructions (Addendum)
Continue Trelegy inhaler You can use the nebs as needed for the periods when you need a little extra help. We will refill the Hycodan cough syrup You can use vitamin D over-the-counter 600 to 800 units/ daily for low vitamin D levels.  Testing reveals you are a carrier for a defective gene called alpha-1 antitrypsin that may predispose you and your family members to increased risk of lung disease, COPD when you smoke or get exposed to inhalational agents Please discuss this further with your family  We will order a follow-up high-resolution CT scan in April of 2020 Follow-up in clinic after scan.

## 2018-12-02 ENCOUNTER — Other Ambulatory Visit: Payer: Self-pay | Admitting: Pulmonary Disease

## 2018-12-02 MED ORDER — FLUTICASONE-UMECLIDIN-VILANT 100-62.5-25 MCG/INH IN AEPB: 1.0000 | INHALATION_SPRAY | Freq: Every day | RESPIRATORY_TRACT | 5 refills | Status: DC

## 2018-12-21 DIAGNOSIS — R059 Cough, unspecified: Secondary | ICD-10-CM

## 2018-12-21 DIAGNOSIS — R05 Cough: Secondary | ICD-10-CM

## 2018-12-21 NOTE — Telephone Encounter (Signed)
Dr. Vaughan Browner please advise on patients message above, she is requesting a refill on her cough syrup. Thank you.

## 2018-12-23 MED ORDER — HYDROCODONE-HOMATROPINE 5-1.5 MG/5ML PO SYRP: 5.0000 mL | ORAL_SOLUTION | Freq: Four times a day (QID) | ORAL | 0 refills | Status: DC | PRN

## 2018-12-23 NOTE — Telephone Encounter (Signed)
Primary Pulmonologist: Dr. Vaughan Browner Last office visit and with whom: 11/11/2018 with Dr. Vaughan Browner What do we see them for (pulmonary problems): IDL, COPD with Asthma Last OV assessment/plan: Repeat CT in April 2020, follow up after CT scan  Was appointment offered to patient (explain)?  No   Reason for call: Refill on Hycodan   Aaron Edelman please advise on refill. Thanks.

## 2018-12-23 NOTE — Telephone Encounter (Signed)
See message above Theresa Franco

## 2018-12-23 NOTE — Telephone Encounter (Signed)
12/23/2018 0940  I have refilled the patient's Hycodan.  Has been sent into the [facility] in [place].  Please  counsel the patient on the sedative effects of Hycodan cough syrup. Patient to use this medication sparingly and not when driving, drinking alcohol, or using additional sedative medications. Patient has been prescribed 267ml with no refills.   Before prescribing the patient with Hycodan cough syrup, I have checked [place] PMP aware and the patients overdose risk score is 160. Patient has 1 providers prescribing controlled substances. Patient has used 1 pharmacies.   Please also explain to the patient that there is sometimes a delay when requesting refills especially of narcotics.  Please be patient with our office as we fill these requests and she can always contact our office for updates as needed as she did today.  Dr. Vaughan Browner is available but he is a hybrid doctor so sometimes he works in the office as well as sometimes in the hospital.  Wyn Quaker, FNP

## 2018-12-23 NOTE — Telephone Encounter (Signed)
Spoke with pt over the phone. She is aware that we have refilled her medication. I have gone over Brian's response with her. She verbalized understanding. Nothing further was needed at this time.

## 2019-02-10 ENCOUNTER — Ambulatory Visit (INDEPENDENT_AMBULATORY_CARE_PROVIDER_SITE_OTHER): Payer: 59 | Admitting: Adult Health

## 2019-02-10 ENCOUNTER — Encounter: Payer: Self-pay | Admitting: Adult Health

## 2019-02-10 DIAGNOSIS — J302 Other seasonal allergic rhinitis: Secondary | ICD-10-CM | POA: Diagnosis not present

## 2019-02-10 DIAGNOSIS — J3089 Other allergic rhinitis: Secondary | ICD-10-CM | POA: Diagnosis not present

## 2019-02-10 DIAGNOSIS — J449 Chronic obstructive pulmonary disease, unspecified: Secondary | ICD-10-CM

## 2019-02-10 MED ORDER — FLUTTER DEVI: 0 refills | Status: DC

## 2019-02-10 NOTE — Assessment & Plan Note (Signed)
Appears controlled.  Continue current regimen 

## 2019-02-10 NOTE — Progress Notes (Signed)
@Patient  ID: Theresa Franco, female    DOB: 1961/07/20, 58 y.o.   MRN: [id number]  Chief Complaint  Patient presents with  . Acute Visit    COPD     Referring provider: Olena Mater, MD  HPI: 58 year old female former smoker followed for COPD, asthma and chronic cough Elevated IgE greater than 3000 previously on Xolair but unable to tolerate due to severe hypertension.  Followed by Dr. [name] and allergy.  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  Pets: 2 dogs but no birds, cats Occupation: Medical lab tech Exposures: No exposure, no mold at home Smoking history: 10-pack-year history.  Quit in 1993. Travel History: Not significant    TEST/EVENTS :  Chest x-ray 11/18/17- no active cardiopulmonary disease.   High-resolution CT 03/10/2018-subtle centrilobular groundglass opacities, air trapping in the upper lobes.  Tiny pulmonary nodules the largest is 3 mm.  Spirometry from [place], [place] 03/10/17 FVC 2.71 [71%], pre-FEV1 1.53 [54%], post FEV1 1.70 [59%], % change 11 F/F 63 ABG 04/07/2017-7.4 2/40/73 Moderate obstruction with improvement in flow rates especially mid flows post bronchodilator suggestive of small airways disease.  03/03/18 FVC 1.70 (41%], FEV1 1.00 (31%], F/F 59, TLC 101, RV/TLC 173%, DLCO 67% Severe obstruction with air trapping, moderate diffusion defect   11/11/18 FVC 2.51 [61%], FEV1 1.51 [47%], F/F 60, TLC 100%, DLCO 82% Severe obstruction.  FENO 11/18/17-13 FENO 01/06/18-14 FENO 04/04/2018-7  Labs: CBC 11/18/17-WBC 4.7, eosinophils 1.1%, absolute eos count 100 RAST panel 11/18/17-IgE 752, sensitive to multiple allergens including cats, dogs, dust mite, pollen  Alpha-1 antitrypsin 03/03/2018-204, PI FM Connective tissue serologies 04/04/2018-ANA, rheumatoid factor, CCP,  hypersensitivity panel, double-stranded DNA, Ro, lab, SCL 70- negative  Bronchoscopy 04/15/2018 WBC 525, 11% lymphs, 89% monocyte macrophage Cultures- Negative Cytology-benign.  Path- benign lung and bronchial mucosa.  02/10/2019 Acute OV  : COPD, Asthma , AR , Abnormal CT chest  Patient presents for a follow-up visit.  She complains over last 2 week she has had a cough and congestion.  She was seen in urgent care and diagnosed with bronchitis and COPD flare.  She was treated with doxycycline and a prednisone taper.  She is just recently finished this.  She is feeling some better however her she continues to have significant cough.  With thick mucus in the morning.  She is very fatigued with low energy.  Says she had a chest x-ray which was reported as normal.  She wears out easily with her coughing.  He is having a hard time completing her working responsibilities.  Feels very rundown and fatigued. Remains on Trelegy inhaler.  She does not have anything for cough. She denies any fever or body aches.  No recent travel.  She denies any hemoptysis chest pain orthopnea PND or leg swelling.  Appetite is fair.  No nausea vomiting or diarrhea.    Allergies  Allergen Reactions  . Amlodipine Swelling and Other (See Comments)    ankles  . Xolair [Omalizumab] Other (See Comments)    BP increased    Immunization History  Administered Date(s) Administered  . Influenza Inj Mdck Quad With Preservative 10/06/2018  . Influenza Whole 09/05/2010, 11/03/2011, 09/26/2012, 09/27/2013, 09/06/2017  . Influenza, Quadrivalent, Recombinant, Inj, Pf 09/06/2017  . Influenza-Unspecified 09/21/2007  . PPD Test 02/17/2012, 03/01/2013  . Pneumococcal Polysaccharide-23 05/04/2018  . Tdap 04/10/2010    Past Medical History:  Diagnosis Date  .  Asthma   . Hypertension     Tobacco History: Social History   Tobacco Use  Smoking Status Former Smoker  . Packs/day: 0.50  . Years: 20.00  . Pack years: 10.00  .  Types: Cigarettes  . Last attempt to quit: 11/18/1992  . Years since quitting: 26.2  Smokeless Tobacco Never Used   Counseling given: Not Answered   Outpatient Medications Prior to Visit  Medication Sig Dispense Refill  . albuterol (PROAIR HFA) 108 (90 Base) MCG/ACT inhaler Inhale 2 puffs into the lungs every 6 (six) hours as needed for wheezing or shortness of breath. 1 Inhaler 1  . ALPRAZolam (XANAX) 0.5 MG tablet Take 0.5 mg by mouth 3 (three) times daily as needed for anxiety.     . Azelastine-Fluticasone (DYMISTA) 137-50 MCG/ACT SUSP Place 1 spray into the nose 2 (two) times daily. 1 Bottle 5  . buPROPion (WELLBUTRIN SR) 150 MG 12 hr tablet Take 150 mg by mouth 2 (two) times daily.    . cetirizine (ZYRTEC) 10 MG tablet Take 1 tablet (10 mg total) by mouth daily. 30 tablet 5  . DUPIXENT 300 MG/2ML SOSY INJECT 300 MG SUBCUTANEOUSLY EVERY OTHER WEEK 2 Syringe 10  . Fluticasone-Umeclidin-Vilant (TRELEGY ELLIPTA) 100-62.5-25 MCG/INH AEPB Inhale 1 puff into the lungs daily. 60 each 5  . HYDROcodone-homatropine (HYCODAN) 5-1.5 MG/5ML syrup Take 5 mLs by mouth every 6 (six) hours as needed for cough. 240 mL 0  . ibuprofen (ADVIL,MOTRIN) 200 MG tablet Take 400 mg by mouth every 6 (six) hours as needed for headache or moderate pain.    Marland Kitchen ipratropium-albuterol (DUONEB) 0.5-2.5 (3) MG/3ML SOLN Take 3 mLs by nebulization every 6 (six) hours as needed. (Patient taking differently: Take 3 mLs by nebulization every 6 (six) hours as needed (for shortness of breath or wheezing). ) 360 mL 3  . losartan (COZAAR) 50 MG tablet Take 50 mg by mouth daily.  0  . montelukast (SINGULAIR) 10 MG tablet Take 1 tablet (10 mg total) by mouth daily. 30 tablet 5  . omeprazole (PRILOSEC) 40 MG capsule TAKE 1 CAPSULE BY MOUTH EVERY DAY 90 capsule 2   No facility-administered medications prior to visit.      Review of Systems:   Constitutional:   No  weight loss, night sweats,  Fevers, chills,  +fatigue, or   lassitude.  HEENT:   No headaches,  Difficulty swallowing,  Tooth/dental problems, or  Sore throat,                No sneezing, itching, ear ache,  +nasal congestion, post nasal drip,   CV:  No chest pain,  Orthopnea, PND, swelling in lower extremities, anasarca, dizziness, palpitations, syncope.   GI  No heartburn, indigestion, abdominal pain, nausea, vomiting, diarrhea, change in bowel habits, loss of appetite, bloody stools.   Resp:   No chest wall deformity  Skin: no rash or lesions.  GU: no dysuria, change in color of urine, no urgency or frequency.  No flank pain, no hematuria   MS:  No joint pain or swelling.  No decreased range of motion.  No back pain.    Physical Exam  BP 128/76 (BP Location: Left Arm, Cuff Size: Normal)   Pulse 83   Ht 5\' 9"  (1.753 m)   Wt 167 lb 9.6 oz (76 kg)   SpO2 97%   BMI 24.75 kg/m   GEN: A/Ox3; pleasant , NAD, well nourished    HEENT:  [place]/AT,  EACs-clear, TMs-wnl, NOSE-clear, THROAT-clear,  no lesions, no postnasal drip or exudate noted.   NECK:  Supple w/ fair ROM; no JVD; normal carotid impulses w/o bruits; no thyromegaly or nodules palpated; no lymphadenopathy.    RESP  Clear  P & A; w/o, wheezes/ rales/ or rhonchi. no accessory muscle use, no dullness to percussion  CARD:  RRR, no m/r/g, no peripheral edema, pulses intact, no cyanosis or clubbing.  GI:   Soft & nt; nml bowel sounds; no organomegaly or masses detected.   Musco: Warm bil, no deformities or joint swelling noted.   Neuro: alert, no focal deficits noted.    Skin: Warm, no lesions or rashes    Lab Results:   BNP No results found for: BNP  ProBNP No results found for: PROBNP  Imaging: No results found.    PFT Results Latest Ref Rng & Units 11/11/2018 03/03/2018  FVC-Pre L 2.60 1.88  FVC-Predicted Pre % 64 46  FVC-Post L 2.51 1.70  FVC-Predicted Post % 61 41  Pre FEV1/FVC % % 58 55  Post FEV1/FCV % % 60 59  FEV1-Pre L 1.50 1.03  FEV1-Predicted Pre %  47 32  FEV1-Post L 1.51 1.00  DLCO UNC% % 82 67  DLCO COR %Predicted % 109 113  TLC L 5.82 5.89  TLC % Predicted % 100 101  RV % Predicted % 144 176    Lab Results  Component Value Date   NITRICOXIDE 7 04/04/2018        Assessment & Plan:   COPD with asthma ([place]) Slow to resolve exacerbation.  Hold on additional antibiotics and steroids at this time.  We will add in Mucinex and Delsym along with flutter valve. Out of work until seen back in the office in 2 weeks. If not improving will need further sooner evaluation.  Plan  Patient Instructions  Add Mucinex Twice daily  As needed  Cough/congestion  Add Delsym 2 tsp Twice daily  As needed  Cough  Add flutter valve Twice daily  .  Continue on TRELEGY 1 puff daily -rinse after use.  Follow up with Dr. Vaughan Browner in 2 weeks and As needed   Please contact office for sooner follow up if symptoms do not improve or worsen or seek emergency care        Seasonal and perennial allergic rhinitis Appears controlled.  Continue current regimen     Rexene Edison, NP 02/10/2019

## 2019-02-10 NOTE — Patient Instructions (Signed)
Add Mucinex Twice daily  As needed  Cough/congestion  Add Delsym 2 tsp Twice daily  As needed  Cough  Add flutter valve Twice daily  .  Continue on TRELEGY 1 puff daily -rinse after use.  Follow up with Dr. Vaughan Browner in 2 weeks and As needed   Please contact office for sooner follow up if symptoms do not improve or worsen or seek emergency care

## 2019-02-10 NOTE — Assessment & Plan Note (Signed)
Slow to resolve exacerbation.  Hold on additional antibiotics and steroids at this time.  We will add in Mucinex and Delsym along with flutter valve. Out of work until seen back in the office in 2 weeks. If not improving will need further sooner evaluation.  Plan  Patient Instructions  Add Mucinex Twice daily  As needed  Cough/congestion  Add Delsym 2 tsp Twice daily  As needed  Cough  Add flutter valve Twice daily  .  Continue on TRELEGY 1 puff daily -rinse after use.  Follow up with Dr. Vaughan Browner in 2 weeks and As needed   Please contact office for sooner follow up if symptoms do not improve or worsen or seek emergency care

## 2019-02-10 NOTE — Addendum Note (Signed)
Addended by: Parke Poisson E on: 02/10/2019 05:08 PM   Modules accepted: Orders

## 2019-02-14 NOTE — Telephone Encounter (Signed)
Letter typed for pt. Placing letter up front for pt to come and pick up. Pt made aware via mychart message. Nothing further needed.

## 2019-02-14 NOTE — Telephone Encounter (Signed)
Yes. It is ok to send the letter stating that.

## 2019-02-14 NOTE — Telephone Encounter (Signed)
Received below mychart message from pt. Dr. Vaughan Browner, please advise what all you want [place] to say in the letter for pt. Thanks!  From: [name]  Sent: 3/10/20209:13 AM EDT  To: [name], MD Subject: Non-Urgent Medical Question  I have a cruise planned for [place].Could you please ask Dr.Mannam to email a note to me, that due to my health condition (severe COPD), I should not go on the cruise.I need this very soon in order to be eligible for a full refund. Thank you so much.

## 2019-02-20 NOTE — Telephone Encounter (Signed)
My Chart message received from Patient. Patient is treated by Dr Vaughan Browner, for COPD, chronic cough.  Her last Ov was with Alvira Monday, NP, 02/10/19.  Patient is requesting Hycodan refill to be sent to [facility], [place], [place].  Hycodan last refill was 12/23/18, 2105ml, by Wyn Quaker, NP.   Last OV 02/10/19, TP Instructions      Return in about 2 weeks (around 02/24/2019) for Follow up with Dr. Vaughan Browner.  Add Mucinex Twice daily  As needed  Cough/congestion  Add Delsym 2 tsp Twice daily  As needed  Cough  Add flutter valve Twice daily  .  Continue on TRELEGY 1 puff daily -rinse after use.  Follow up with Dr. Vaughan Browner in 2 weeks and As needed   Please contact office for sooner follow up if symptoms do not improve or worsen or seek emergency care

## 2019-02-20 NOTE — Telephone Encounter (Signed)
We will not be able to do this at this time  Please continue with cough recs from last ov    It has not been a month since her last refill of this  Will need to discuss at follow up with DR. Mannam if he is going to continue to fill this rx ongoing .   Please contact office for sooner follow up if symptoms do not improve or worsen or seek emergency care

## 2019-02-21 ENCOUNTER — Telehealth: Payer: Self-pay | Admitting: Pulmonary Disease

## 2019-02-21 NOTE — Telephone Encounter (Signed)
Given faxed disability paperwork by Cherina, it was faxed today and placed in Dr. Matilde Bash box up front. Advised Cherina I will fwd to [employer] via interoffice mail -pr

## 2019-02-21 NOTE — Telephone Encounter (Signed)
Spoke with patient. Explained to her that I was able to find the papers downstairs in Dr. Matilde Bash box. They have been given to [facility]. She will then send them to [employer] for them to be processed. I also gave her the number to [employer] to follow up on in the next few days.   She verbalized understanding to everything. Nothing further needed at time of call.

## 2019-02-21 NOTE — Telephone Encounter (Signed)
Noted  

## 2019-02-21 NOTE — Telephone Encounter (Signed)
Called Melissa, unable to reach anyone and was on hold for over 7 minutes

## 2019-02-21 NOTE — Telephone Encounter (Signed)
Patient returned phone call, states she is checking the status of FMLA paperwork.   Call back # [phone]

## 2019-02-27 ENCOUNTER — Telehealth: Payer: Self-pay | Admitting: Pulmonary Disease

## 2019-02-27 NOTE — Telephone Encounter (Signed)
Called and spoke with patient, she wanted to know if Dr. Vaughan Browner is willing to write her out of work until we start seeing patients back in the office. She understands that it may be awhile before patients are coming back in.   Dr. Vaughan Browner please advise, thank you.

## 2019-02-27 NOTE — Telephone Encounter (Signed)
Dr. Vaughan Browner please advise patient is also asking for a refill of her cough medication. She stated that her chronic cough is still causing her issues falling asleep at night. Patient has no other new symptoms. Please advise, thank you.

## 2019-02-28 ENCOUNTER — Encounter: Payer: Self-pay | Admitting: *Deleted

## 2019-02-28 ENCOUNTER — Ambulatory Visit (INDEPENDENT_AMBULATORY_CARE_PROVIDER_SITE_OTHER): Payer: 59 | Admitting: Pulmonary Disease

## 2019-02-28 ENCOUNTER — Encounter: Payer: Self-pay | Admitting: Pulmonary Disease

## 2019-02-28 ENCOUNTER — Other Ambulatory Visit: Payer: Self-pay

## 2019-02-28 VITALS — BP 122/62 | HR 88 | Ht 69.0 in | Wt 165.8 lb

## 2019-02-28 DIAGNOSIS — J849 Interstitial pulmonary disease, unspecified: Secondary | ICD-10-CM

## 2019-02-28 DIAGNOSIS — R05 Cough: Secondary | ICD-10-CM

## 2019-02-28 DIAGNOSIS — J449 Chronic obstructive pulmonary disease, unspecified: Secondary | ICD-10-CM

## 2019-02-28 DIAGNOSIS — R059 Cough, unspecified: Secondary | ICD-10-CM

## 2019-02-28 MED ORDER — HYDROCODONE-HOMATROPINE 5-1.5 MG/5ML PO SYRP: 5.0000 mL | ORAL_SOLUTION | Freq: Four times a day (QID) | ORAL | 0 refills | Status: DC | PRN

## 2019-02-28 NOTE — Patient Instructions (Signed)
I am glad that you are recovering from your recent bronchitis. Continue the trelegy inhaler as perscribed You can use the duonebs as needed for dyspnea  Please give [place] a call if there is any change in symptoms. Follow up in July

## 2019-02-28 NOTE — Progress Notes (Signed)
Theresa Franco    [id number]    03/23/1961  Primary Care Physician:Zimmer, [name], MD  Referring Physician: Olena Mater, [place] [address]  Chief complaint: Follow up for cough, COPD, asthma.  HPI: 58 year old with past medical history of hypertension, asthma, allergies.  She has history of chronic cough for several years.  She is evaluated by Dr. [name], Pulmonologist at [place], [place].  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  She was given Xolair but had to stop after 1 dose as she developed severe hypertension.  She has been tried on breo, trelegy without any improvement in symptoms.  Her peripheral blood count does not show any eosinophilia and she was not a candidate for anti-IL5 therapy.  As per the patient she underwent a bronchoscopy in September 2018 to evaluate for endobronchial lesions which did not show any abnormalities. Medications significant for losartan.  She had been off this for > 1 year with no change in cough.  It had to be restarted after she developed hypertension after Xolair.  She has cough which is mostly nonproductive in nature.  This is more at night and sometimes keeps her up.  She has dyspnea with exertion.  Denies dyspnea at rest, no wheezing, fevers, chills.  Denies any heartburn symptoms.  Followed by Dr. [name], Allergy.  Started on [place] therapy. CT in April 2019 shows centrilobular nodules for which she underwent bronchoscopy.  Results are nondiagnostic.  Pets: 2 dogs but no birds, cats Occupation: Medical lab tech Exposures: No exposure, no mold at home Smoking history: 10-pack-year history.  Quit in 1993. Travel History: Not significant  Interim history: Seen in clinic earlier this month for COPD exacerbation.  Treated with antibiotics and steroids.  She has continued to improve symptomatically.  Complains of chronic dyspnea on exertion. Continues on Trelegy inhaler with stable lung  symptoms.  Outpatient Encounter Medications as of 02/28/2019  Medication Sig  . albuterol (PROAIR HFA) 108 (90 Base) MCG/ACT inhaler Inhale 2 puffs into the lungs every 6 (six) hours as needed for wheezing or shortness of breath.  . ALPRAZolam (XANAX) 0.5 MG tablet Take 0.5 mg by mouth 3 (three) times daily as needed for anxiety.   . Azelastine-Fluticasone (DYMISTA) 137-50 MCG/ACT SUSP Place 1 spray into the nose 2 (two) times daily.  Marland Kitchen buPROPion (WELLBUTRIN SR) 150 MG 12 hr tablet Take 150 mg by mouth 2 (two) times daily.  . cetirizine (ZYRTEC) 10 MG tablet Take 1 tablet (10 mg total) by mouth daily.  . DUPIXENT 300 MG/2ML SOSY INJECT 300 MG SUBCUTANEOUSLY EVERY OTHER WEEK  . Fluticasone-Umeclidin-Vilant (TRELEGY ELLIPTA) 100-62.5-25 MCG/INH AEPB Inhale 1 puff into the lungs daily.  Marland Kitchen HYDROcodone-homatropine (HYCODAN) 5-1.5 MG/5ML syrup Take 5 mLs by mouth every 6 (six) hours as needed for cough.  Marland Kitchen ibuprofen (ADVIL,MOTRIN) 200 MG tablet Take 400 mg by mouth every 6 (six) hours as needed for headache or moderate pain.  Marland Kitchen ipratropium-albuterol (DUONEB) 0.5-2.5 (3) MG/3ML SOLN Take 3 mLs by nebulization every 6 (six) hours as needed. (Patient taking differently: Take 3 mLs by nebulization every 6 (six) hours as needed (for shortness of breath or wheezing). )  . losartan (COZAAR) 50 MG tablet Take 50 mg by mouth daily.  . montelukast (SINGULAIR) 10 MG tablet Take 1 tablet (10 mg total) by mouth daily.  Marland Kitchen omeprazole (PRILOSEC) 40 MG capsule TAKE 1 CAPSULE BY MOUTH EVERY DAY  . Respiratory Therapy  Supplies (FLUTTER) DEVI Use as directed.  . [DISCONTINUED] HYDROcodone-homatropine (HYCODAN) 5-1.5 MG/5ML syrup Take 5 mLs by mouth every 6 (six) hours as needed for cough.   No facility-administered encounter medications on file as of 02/28/2019.    Physical Exam: Blood pressure 122/62, pulse 88, height _0  (1.753 m), weight 165 lb 12.8 oz (75.2 kg), SpO2 99 %. Gen:      No acute distress HEENT:   EOMI, sclera anicteric Neck:     No masses; no thyromegaly Lungs:    Clear to auscultation bilaterally; normal respiratory effort CV:         Regular rate and rhythm; no murmurs Abd:      + bowel sounds; soft, non-tender; no palpable masses, no distension Ext:    No edema; adequate peripheral perfusion Skin:      Warm and dry; no rash Neuro: alert and oriented x 3 Psych: normal mood and affect  Data Reviewed: Imaging: Chest x-ray 11/18/17- no active cardiopulmonary disease.   High-resolution CT 03/10/2018-subtle centrilobular groundglass opacities, air trapping in the upper lobes.  Tiny pulmonary nodules the largest is 3 mm. I have reviewed the images personally.  PFTs: Spirometry from [place], [place] 03/10/17 FVC 2.71 [71%], pre-FEV1 1.53 [54%], post FEV1 1.70 [59%], % change 11 F/F 63 ABG 04/07/2017-7.4 2/40/73 Moderate obstruction with improvement in flow rates especially mid flows post bronchodilator suggestive of small airways disease.  03/03/18 FVC 1.70 (41%], FEV1 1.00 (31%], F/F 59, TLC 101, RV/TLC 173%, DLCO 67% Severe obstruction with air trapping, moderate diffusion defect   11/11/18 FVC 2.51 [61%], FEV1 1.51 [47%], F/F 60, TLC 100%, DLCO 82% Severe obstruction.  FENO 11/18/17-13 FENO 01/06/18-14 FENO 04/04/2018-7  Labs: CBC 11/18/17-WBC 4.7, eosinophils 1.1%, absolute eos count 100 RAST panel 11/18/17-IgE 752, sensitive to multiple allergens including cats, dogs, dust mite, pollen  Alpha-1 antitrypsin 03/03/2018-204, PI FM Connective tissue serologies 04/04/2018-ANA, rheumatoid factor, CCP, hypersensitivity panel, double-stranded DNA, Ro, lab, SCL 70- negative  Bronchoscopy 04/15/2018 WBC 525, 11% lymphs, 89% monocyte macrophage Cultures- Negative Cytology-benign.  Path- benign lung and bronchial mucosa.  Assessment:  Severe COPD, asthmatic bronchitis PFTs reviewed with severe obstruction, air trapping.   Continue Trelegy inhaler.  Heterozygous antitrypsin  carrier PI FM allele noted on testing Total levels of alpha-1 antitrypsin are normal although the protein itself likely has defective function Results discussed with patient and advised that her family members may need testing as well.  Allergic rhinitis, asthma Being followed at the allergy clinic by Dr. [name].  Currently on Dupixent therapy.  GERD On omeprazole  Abnormal CT scan CT scan reviewed with mild centrilobular nodules, air trapping.  Bronchoscopy and labs for connective tissue disease, hypersensitivity pneumonitis are negative There is no evidence of lymphocytosis or eosinophilia on BAL.  At this point I do not have a clear evidence of interstitial lung disease.  I suspect that the findings on CT scan may have been nonspecific inflammatory findings since the scan was done as she was recovering from an exacerbation.  We will continue to monitor this with repeat CT scan later this year  Health maintenance 10/06/2018-influenza 04/04/2018-Pneumovax  Plan/Recommendations: - Continue trelegy, duo nebs as needed - Hycodan cough syrup - Follow up high res CT  [name] MD [facility] 02/28/2019, 9:45 AM  CC: Olena Mater, MD

## 2019-03-02 NOTE — Telephone Encounter (Signed)
We sent in a prescription for cough syrup at last clinic visit on 3/24. Has she filled it. Would prefer not to refill so soon.  [name] MD [facility] 03/02/2019, 11:07 AM

## 2019-03-02 NOTE — Telephone Encounter (Signed)
Dr. Mannam - please advise. Thanks. 

## 2019-03-02 NOTE — Telephone Encounter (Signed)
Pt was seen for an appt 02/28/2019 by Dr. Vaughan Browner and Rx for pt's hycodan was taken care of at that visit. This encounter was just not closed after pt came in for her OV. Nothing further needed.

## 2019-03-08 ENCOUNTER — Telehealth: Payer: Self-pay | Admitting: Pulmonary Disease

## 2019-03-08 NOTE — Telephone Encounter (Signed)
Kathlee Nations, Patient sent this my chart message this afternoon. Is there anyway you can assist?   Could you please ask Dr. Vaughan Browner to review the CPT codes used for my bronchoscopy performed at [facility] on May 2019.[employer] is considering CPT [phone] as experimental and denying payment of about $3000.You attention to this matter is greatly appreciated.  Message routed to [employer] 

## 2019-03-08 NOTE — Telephone Encounter (Signed)
Rec'd disability paperwork via interoffice mail from [employer] , given to Tammy Parrett to sign since Dr. Vaughan Browner is out of the office to COVID-19 physician scheduling. Signed by Hermina Barters and fwd to [employer] via interoffice mail-pr

## 2019-03-13 NOTE — Telephone Encounter (Signed)
  Patient has sent another message about her bronchoscopy being coded wrong. Previous message routed to Kathlee Nations, to see if she could assist in any way.  I want to follow up on my previous question concerning the way my bronchoscopy was coded.Can you tell me if someone has been able to review that?Thank you.  Message routed to [employer] and Dr Vaughan Browner

## 2019-03-16 ENCOUNTER — Other Ambulatory Visit: Payer: Self-pay | Admitting: Allergy & Immunology

## 2019-03-20 ENCOUNTER — Telehealth: Payer: Self-pay | Admitting: Pulmonary Disease

## 2019-03-20 NOTE — Telephone Encounter (Signed)
Rec'd faxed disability paperwork from [place]. Fwd to [employer] via interoffice mail -pr

## 2019-03-20 NOTE — Telephone Encounter (Signed)
This is the patient we spoke about - She can be reached at [phone]. - pr

## 2019-03-21 NOTE — Telephone Encounter (Signed)
Kathlee Nations, are you able to look into her issue with the bronch code? Thanks!

## 2019-03-27 ENCOUNTER — Ambulatory Visit: Payer: BLUE CROSS/BLUE SHIELD | Admitting: Pulmonary Disease

## 2019-04-05 ENCOUNTER — Telehealth: Payer: Self-pay | Admitting: Adult Health

## 2019-04-06 ENCOUNTER — Ambulatory Visit: Payer: BLUE CROSS/BLUE SHIELD | Admitting: Pulmonary Disease

## 2019-04-06 NOTE — Telephone Encounter (Signed)
Forms received yesterday 4.29.2020 and placed in TP's to-do folder

## 2019-04-10 NOTE — Telephone Encounter (Signed)
Rec'd completed forms - fwd to [employer] via interoffice mail -pr  °

## 2019-04-10 NOTE — Telephone Encounter (Signed)
Done.  Hanley Seamen to [place]

## 2019-04-13 DIAGNOSIS — R05 Cough: Secondary | ICD-10-CM

## 2019-04-13 DIAGNOSIS — R059 Cough, unspecified: Secondary | ICD-10-CM

## 2019-04-14 ENCOUNTER — Ambulatory Visit: Payer: 59 | Admitting: Allergy & Immunology

## 2019-04-14 NOTE — Telephone Encounter (Signed)
Pt sent MyChart message requesting to have her Hycodan prescription refilled. Pt last seen 02/28/2019 by Dr. Vaughan Browner. In the AVS, hycodan cough syrup was recommended for pt. Hycodan 5 mLs was prescribed 02/28/2019 by Dr. Vaughan Browner, total supply of 240 mL w/ 0 refills. Pt states she is being treated for Stage 3 COPD, in which gives her a chronic cough.   Current Outpatient Medications on File Prior to Visit  Medication Sig Dispense Refill  . albuterol (PROAIR HFA) 108 (90 Base) MCG/ACT inhaler Inhale 2 puffs into the lungs every 6 (six) hours as needed for wheezing or shortness of breath. 1 Inhaler 1  . ALPRAZolam (XANAX) 0.5 MG tablet Take 0.5 mg by mouth 3 (three) times daily as needed for anxiety.     . Azelastine-Fluticasone (DYMISTA) 137-50 MCG/ACT SUSP Place 1 spray into the nose 2 (two) times daily. 1 Bottle 5  . buPROPion (WELLBUTRIN SR) 150 MG 12 hr tablet Take 150 mg by mouth 2 (two) times daily.    . cetirizine (ZYRTEC) 10 MG tablet Take 1 tablet (10 mg total) by mouth daily. 30 tablet 5  . DUPIXENT 300 MG/2ML SOSY INJECT 300 MG SUBCUTANEOUSLY EVERY OTHER WEEK 2 Syringe 10  . Fluticasone-Umeclidin-Vilant (TRELEGY ELLIPTA) 100-62.5-25 MCG/INH AEPB Inhale 1 puff into the lungs daily. 60 each 5  . HYDROcodone-homatropine (HYCODAN) 5-1.5 MG/5ML syrup Take 5 mLs by mouth every 6 (six) hours as needed for cough. 240 mL 0  . ibuprofen (ADVIL,MOTRIN) 200 MG tablet Take 400 mg by mouth every 6 (six) hours as needed for headache or moderate pain.    Marland Kitchen ipratropium-albuterol (DUONEB) 0.5-2.5 (3) MG/3ML SOLN Take 3 mLs by nebulization every 6 (six) hours as needed. (Patient taking differently: Take 3 mLs by nebulization every 6 (six) hours as needed (for shortness of breath or wheezing). ) 360 mL 3  . losartan (COZAAR) 50 MG tablet Take 50 mg by mouth daily.  0  . montelukast (SINGULAIR) 10 MG tablet Take 1 tablet by mouth once daily 30 tablet 0  . omeprazole (PRILOSEC) 40 MG capsule TAKE 1 CAPSULE BY MOUTH  EVERY DAY 90 capsule 2  . Respiratory Therapy Supplies (FLUTTER) DEVI Use as directed. 1 each 0   No current facility-administered medications on file prior to visit.    Allergies  Allergen Reactions  . Amlodipine Swelling and Other (See Comments)    ankles  . Xolair [Omalizumab] Other (See Comments)    BP increased   Dr. Vaughan Browner, please advise on the refill of this medication. Thank you.

## 2019-04-18 ENCOUNTER — Telehealth: Payer: Self-pay | Admitting: Pulmonary Disease

## 2019-04-18 ENCOUNTER — Telehealth: Payer: Self-pay | Admitting: General Surgery

## 2019-04-18 DIAGNOSIS — R059 Cough, unspecified: Secondary | ICD-10-CM

## 2019-04-18 DIAGNOSIS — R05 Cough: Secondary | ICD-10-CM

## 2019-04-18 MED ORDER — HYDROCODONE-HOMATROPINE 5-1.5 MG/5ML PO SYRP: 5.0000 mL | ORAL_SOLUTION | Freq: Four times a day (QID) | ORAL | 0 refills | Status: DC | PRN

## 2019-04-18 NOTE — Telephone Encounter (Signed)
Message routed to app of the day, Derl Barrow to review and advise.  Beth, this patient was seen by Dr.Mannam 02/28/19 and issued Hycodan for chronic cough. The patient requests a refill of the medication. A message was sent to Dr. Vaughan Browner on Friday afternoon based on the response Annie Paras gave the patient, but a response has not been received yet.   Are you o.k. with sending in a refill? Please advise. Thank you.

## 2019-04-18 NOTE — Telephone Encounter (Signed)
sent 

## 2019-04-18 NOTE — Telephone Encounter (Signed)
Message below routed to app of the day, Derl Barrow, NP.  This patient sent an email request on 04/13/19 for a Hycodan refill. It was issued back on 02/28/19 the same day as office visit with Dr. Vaughan Browner. He directed to use 5 ml every 6 hours as needed for cough with a dispense of 240 ml.  I sent a response back to her on 04/14/19 asking some basic status questions. She responded back that it was issued for chronic cough. Friday afternoon Janeth Rase sent a response back to the patient that a message was sent to Dr. Vaughan Browner to request this be filled. However, no reply has been received yet.  Would you be willing to issue this patient a refill of the Hycodan for the same amount?

## 2019-04-18 NOTE — Telephone Encounter (Signed)
Ok to refill once, any further RX refill needs to discuss further with Dr. Vaughan Browner. This is not a long term medication and needs to be used with caution.

## 2019-04-18 NOTE — Telephone Encounter (Signed)
Spoke with pt and advised rx sent to pharmacy. Nothing further is needed.   

## 2019-04-18 NOTE — Telephone Encounter (Signed)
Received message from [employer] requesting a clarification form.   Message routed to [facility]

## 2019-04-19 ENCOUNTER — Telehealth: Payer: Self-pay | Admitting: Pulmonary Disease

## 2019-04-19 NOTE — Telephone Encounter (Signed)
All forms need to go to [facility]. I have not received any forms via interoffice form [employer]. If Melissa calls again, please give the [employer]'s phone number [phone]. -pr

## 2019-04-19 NOTE — Telephone Encounter (Signed)
Attempted to reach back out to [place] at [place]. Spoke with Lilla Shook customer claim support rep.Advised that she make notation so that pts claim would not be slowed down due to lack of communication. Gave contact phone and fax numbers for [employer] and explained that is we do not complete form in office. Advised that no clarification form has been received as of today. Nothing further needed.

## 2019-04-24 NOTE — Telephone Encounter (Signed)
Called and spoke with patient regarding her billing concerns Pt would like to know why her bronch was billed to the hospital rather than the surgeron.  Pt is requesting to speak to Kathlee Nations; she left [facility] for Kathlee Nations last week regarding this matter.  Pt states that [place] states that Dr. Vaughan Browner completed 3 bronchs the same day as the pt, and only billed for 2.  Kathlee Nations please advise. Thank you

## 2019-04-25 NOTE — Telephone Encounter (Signed)
Called [facility], spoke with Jeanett Schlein, she did see where they billed a [phone], I explained that after reviewing all the documentation, it looked like that should not have been billed, she is going to send it up to the coding review team to review and correct.  Called patient back and explained what was going on.

## 2019-05-15 ENCOUNTER — Telehealth: Payer: Self-pay | Admitting: Pulmonary Disease

## 2019-05-15 NOTE — Telephone Encounter (Signed)
Paperwork  received from [place] and will be given to Dr Vaughan Browner, 05/16/19, clinic.

## 2019-05-16 NOTE — Telephone Encounter (Signed)
Paper work hand delivered to Dr Vaughan Browner this morning, 05/16/19.

## 2019-05-17 NOTE — Telephone Encounter (Signed)
Signed paperwork received from Dr Vaughan Browner.  Halford Decamp delivered paperwork to [facility] to return to [employer].  Nothing further at this time.

## 2019-05-18 NOTE — Telephone Encounter (Signed)
Rec'd completed disability forms from Dr. Vaughan Browner. Fwd forms to [employer] via interoffice mail -pr

## 2019-05-22 ENCOUNTER — Other Ambulatory Visit: Payer: Self-pay

## 2019-05-22 ENCOUNTER — Encounter: Payer: Self-pay | Admitting: Acute Care

## 2019-05-22 ENCOUNTER — Ambulatory Visit (INDEPENDENT_AMBULATORY_CARE_PROVIDER_SITE_OTHER): Payer: 59 | Admitting: Acute Care

## 2019-05-22 DIAGNOSIS — K219 Gastro-esophageal reflux disease without esophagitis: Secondary | ICD-10-CM | POA: Diagnosis not present

## 2019-05-22 DIAGNOSIS — J449 Chronic obstructive pulmonary disease, unspecified: Secondary | ICD-10-CM | POA: Diagnosis not present

## 2019-05-22 DIAGNOSIS — J849 Interstitial pulmonary disease, unspecified: Secondary | ICD-10-CM

## 2019-05-22 DIAGNOSIS — J4489 Other specified chronic obstructive pulmonary disease: Secondary | ICD-10-CM

## 2019-05-22 NOTE — Assessment & Plan Note (Signed)
Stable interval Last flare 02/2019 Plan Continue your Trelegy  One puff once daily Rinse mouth after use Continue your Singulair daily Continue using your Zyrtec daily Continue using you Dymista twice daily  Continue using your albuterol inhaler/ nebs as needed for breakthrough shortness of breath or wheezing.  Continue  Dupixent therapy per Dr. [name] Follow up 6/29 if needed or we can cancel within 24 hours if you feel you need this.  We will have the office see if they can set up an appointment with Dr. Vaughan Browner on a day he is here. >> First available please. Please contact office for sooner follow up if symptoms do not improve or worsen or seek emergency care

## 2019-05-22 NOTE — Assessment & Plan Note (Signed)
Continue Omeprazole ?

## 2019-05-22 NOTE — Assessment & Plan Note (Addendum)
Initial CT 03/2019  Mild patchy upper lung predominant ground-glass centrilobular micronodularity. 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

## 2019-05-22 NOTE — Patient Instructions (Addendum)
It is good to see you today. We will send Dr. Vaughan Browner a message about possibly having you resume work at a half time basis for 2 weeks to see how you do.  Continue your Trelegy  One puff once daily Rinse mouth after use Continue your Singulair daily Continue using your Zyrtec daily Continue using you Dymista twice daily  Continue using your albuterol inhaler/ nebs as needed for breakthrough shortness of breath or wheezing.  Continue  Dupixent therapy per Dr. [name] Continue Omeprazole as you have been doing Follow up 6/29 if needed or we can cancel within 24 hours if you feel you need this.  We will have the office see if they can set up an appointment with Dr. Vaughan Browner on a day he is here. >> First available please. Plan for follow up HRCT Chest later this year per Dr. Vaughan Browner  Please contact office for sooner follow up if symptoms do not improve or worsen or seek emergency care

## 2019-05-22 NOTE — Progress Notes (Signed)
History of Present Illness Theresa Franco is a 58 y.o. female former smoker quit 1993 with a 5 pack year smoking history with COPD, allergies  and asthma. She also has a history of a chronic cough. She is followed by Dr. Vaughan Browner Maintenance Medications are Trelegy Singulair Pro Air for rescue  Albuterol nebs as needed for breakthrough shortness of breath   Followed by Dr. [name], currently on Price: 2 dogs but no birds, cats Occupation: Medical lab tech Exposures: No exposure, no mold at home Smoking history: 10-pack-year history.  Quit in 1993. Travel History: Not significant   05/22/2019  Follow up appointment for concern about going back to work and  having to wear a  face mask and a shield for protection from Covid 19.She is currently clinically stable. She is compliant with her maintenance medications. She was taken out of work 02/13/2019. At the time she had chronic bronchitis. She was seen by Dr. Vaughan Browner and he approved that she could remain out of work on short term disability  until 06/06/2019. She is a Therapist, sports. Pt.presents today stating that she has a very difficult time wearing a mask and she is anxious about wearing a face shield at work when she is due to return in 2 weeks. She states she is also having hot flashes and does not feel she can tolerate the additional PPE as it will be hot, which triggers her asthma. . She states she wants to go to work, but she is afraid she will not be able to breath. She does not want to struggle to breath, which I understand. She wonders if she can perhaps go back on a part time basis to see how she does initially. She has a lot of anxiety about returning to work with the new PPD requirements and how they may impact her ability to breath. We also discussed using her rescue inhaler in the morning and the afternoon to see if this may help with her breathlessness. She denies any fever, chest pain, orthopnea or  hemoptysis.    Health maintenance 10/06/2018-influenza 04/04/2018-Pneumovax  Test Results: Imaging: Chest x-ray 11/18/17- no active cardiopulmonary disease.   High-resolution CT 03/10/2018-subtle centrilobular groundglass opacities, air trapping in the upper lobes.  Tiny pulmonary nodules the largest is 3 mm. I have reviewed the images personally.  PFTs: Spirometry from [place], [place] 03/10/17 FVC 2.71 [71%], pre-FEV1 1.53 [54%], post FEV1 1.70 [59%], % change 11 F/F 63 ABG 04/07/2017-7.4 2/40/73 Moderate obstruction with improvement in flow rates especially mid flows post bronchodilator suggestive of small airways disease.  03/03/18 FVC 1.70 (41%], FEV1 1.00 (31%], F/F 59, TLC 101, RV/TLC 173%, DLCO 67% Severe obstruction with air trapping, moderate diffusion defect   11/11/18 FVC 2.51 [61%], FEV1 1.51 [47%], F/F 60, TLC 100%, DLCO 82% Severe obstruction.  FENO 11/18/17-13 FENO 01/06/18-14 FENO 04/04/2018-7  Labs: CBC 11/18/17-WBC 4.7, eosinophils 1.1%, absolute eos count 100 RAST panel 11/18/17-IgE 752, sensitive to multiple allergens including cats, dogs, dust mite, pollen  Alpha-1 antitrypsin 03/03/2018-204, PI FM Connective tissue serologies 04/04/2018-ANA, rheumatoid factor, CCP, hypersensitivity panel, double-stranded DNA, Ro, lab, SCL 70- negative  Bronchoscopy 04/15/2018 WBC 525, 11% lymphs, 89% monocyte macrophage Cultures- Negative Cytology-benign.  Path- benign lung and bronchial mucosa.   CBC Latest Ref Rng & Units 04/04/2018 03/03/2018 11/18/2017  WBC 4.0 - 10.5 K/uL 3.7(L) 14.5(H) 4.7  Hemoglobin 12.0 - 15.0 g/dL 14.0 14.1 14.2  Hematocrit 36.0 - 46.0 % 42.1 41.7 42.2  Platelets  150.0 - 400.0 K/uL 206.0 227.0 213.0    BMP Latest Ref Rng & Units 03/03/2018  Glucose 70 - 99 mg/dL 115(H)  BUN 6 - 23 mg/dL 13  Creatinine 0.40 - 1.20 mg/dL 1.10  Sodium 135 - 145 mEq/L 139  Potassium 3.5 - 5.1 mEq/L 4.0  Chloride 96 - 112 mEq/L 102  CO2 19 - 32 mEq/L 30   Calcium 8.4 - 10.5 mg/dL 9.6    BNP No results found for: BNP  ProBNP No results found for: PROBNP  PFT    Component Value Date/Time   FEV1PRE 1.50 11/11/2018 1402   FEV1POST 1.51 11/11/2018 1402   FVCPRE 2.60 11/11/2018 1402   FVCPOST 2.51 11/11/2018 1402   TLC 5.82 11/11/2018 1402   DLCOUNC 25.51 11/11/2018 1402   PREFEV1FVCRT 58 11/11/2018 1402   PSTFEV1FVCRT 60 11/11/2018 1402    No results found.   Past medical hx Past Medical History:  Diagnosis Date   Asthma    Hypertension      Social History   Tobacco Use   Smoking status: Former Smoker    Packs/day: 0.50    Years: 11.00    Pack years: 5.50    Types: Cigarettes    Start date: 28    Quit date: 11/18/1992    Years since quitting: 26.5   Smokeless tobacco: Never Used  Substance Use Topics   Alcohol use: Yes    Comment: 3/week   Drug use: No    Theresa Franco reports that she quit smoking about 26 years ago. Her smoking use included cigarettes. She started smoking about 38 years ago. She has a 5.50 pack-year smoking history. She has never used smokeless tobacco. She reports current alcohol use. She reports that she does not use drugs.  Tobacco Cessation: Former smoker quit 1993  Past surgical hx, Family hx, Social hx all reviewed.  Current Outpatient Medications on File Prior to Visit  Medication Sig   albuterol (PROAIR HFA) 108 (90 Base) MCG/ACT inhaler Inhale 2 puffs into the lungs every 6 (six) hours as needed for wheezing or shortness of breath.   ALPRAZolam (XANAX) 0.5 MG tablet Take 0.5 mg by mouth 3 (three) times daily as needed for anxiety.    Azelastine-Fluticasone (DYMISTA) 137-50 MCG/ACT SUSP Place 1 spray into the nose 2 (two) times daily.   buPROPion (WELLBUTRIN SR) 150 MG 12 hr tablet Take 150 mg by mouth 2 (two) times daily.   cetirizine (ZYRTEC) 10 MG tablet Take 1 tablet (10 mg total) by mouth daily.   Fluticasone-Umeclidin-Vilant (TRELEGY ELLIPTA) 100-62.5-25 MCG/INH  AEPB Inhale 1 puff into the lungs daily.   HYDROcodone-homatropine (HYCODAN) 5-1.5 MG/5ML syrup Take 5 mLs by mouth every 6 (six) hours as needed for cough.   ibuprofen (ADVIL,MOTRIN) 200 MG tablet Take 400 mg by mouth every 6 (six) hours as needed for headache or moderate pain.   ipratropium-albuterol (DUONEB) 0.5-2.5 (3) MG/3ML SOLN Take 3 mLs by nebulization every 6 (six) hours as needed. (Patient taking differently: Take 3 mLs by nebulization every 6 (six) hours as needed (for shortness of breath or wheezing). )   montelukast (SINGULAIR) 10 MG tablet Take 1 tablet by mouth once daily   omeprazole (PRILOSEC) 40 MG capsule TAKE 1 CAPSULE BY MOUTH EVERY DAY   Respiratory Therapy Supplies (FLUTTER) DEVI Use as directed.   losartan (COZAAR) 50 MG tablet Take 50 mg by mouth daily.   No current facility-administered medications on file prior to visit.      Allergies  Allergen Reactions   Amlodipine Swelling and Other (See Comments)    ankles   Xolair [Omalizumab] Other (See Comments)    BP increased    Review Of Systems:  Constitutional:   No  weight loss, night sweats,  Fevers, chills, fatigue, or  lassitude.  HEENT:   No headaches,  Difficulty swallowing,  Tooth/dental problems, or  Sore throat,                No sneezing, itching, ear ache, nasal congestion, post nasal drip,   CV:  No chest pain,  Orthopnea, PND, swelling in lower extremities, anasarca, dizziness, palpitations, syncope.   GI  No heartburn, indigestion, abdominal pain, nausea, vomiting, diarrhea, change in bowel habits, loss of appetite, bloody stools.   Resp: + shortness of breath with wearing a mask, not  at rest.  No excess mucus, occasional  productive cough,  No non-productive cough,  No coughing up of blood.  No change in color of mucus.  No wheezing.  No chest wall deformity  Skin: no rash or lesions.  GU: no dysuria, change in color of urine, no urgency or frequency.  No flank pain, no hematuria    MS:  No joint pain or swelling.  No decreased range of motion.  No back pain.  Psych:  No change in mood or affect. No depression or anxiety.  No memory loss.   Vital Signs BP 126/84 (BP Location: Right Arm, Patient Position: Sitting, Cuff Size: Normal)    Pulse 100    Temp 98.1 F (36.7 C)    Ht 5\' 9"  (1.753 m)    Wt 169 lb (76.7 kg)    SpO2 (!) 84%    BMI 24.96 kg/m    Physical Exam:  General- No distress,  A&Ox3, pleasant ENT: No sinus tenderness, TM clear, pale nasal mucosa, no oral exudate,no post nasal drip, no LAN Cardiac: S1, S2, regular rate and rhythm, no murmur Chest: No wheeze/ rales/ dullness; no accessory muscle use, no nasal flaring, no sternal retractions Abd.: Soft Non-tender, ND, BS +, Body mass index is 24.96 kg/m. Ext: No clubbing cyanosis, edema Neuro:  normal strength, MAE x 4, A&O x 3 Skin: No rashes, warm and dry Psych: normal mood and behavior, anxious   Assessment/Plan  COPD with asthma (HCC) Stable interval Last flare 02/2019 Plan Continue your Trelegy  One puff once daily Rinse mouth after use Continue your Singulair daily Continue using your Zyrtec daily Continue using you Dymista twice daily  Continue using your albuterol inhaler/ nebs as needed for breakthrough shortness of breath or wheezing.  Continue  Dupixent therapy per Dr. [name] Follow up 6/29 if needed or we can cancel within 24 hours if you feel you need this.  We will have the office see if they can set up an appointment with Dr. Vaughan Browner on a day he is here. >> First available please. Please contact office for sooner follow up if symptoms do not improve or worsen or seek emergency care     ILD (interstitial lung disease) ([place]) Initial CT 03/2019  Mild patchy upper lung predominant ground-glass centrilobular micronodularity. 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 With underlying conditions that make her high risk in setting of COVID 19 Feels she will get short of breath in Mask and face shield as required PPE  to return to work  Plan We discussed working 20 hours a week initially to see how well she can handle the PPE We discussed using her rescue inhaler in the morning and in the afternoon if she does have breathlessness while wearing required PPE. Will defer to Dr. Vaughan Browner re: extension of short term disability vs. Trying to return to work. 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   Magdalen Spatz, NP 05/22/2019  12:32 PM

## 2019-05-31 ENCOUNTER — Encounter: Payer: Self-pay | Admitting: Allergy & Immunology

## 2019-05-31 ENCOUNTER — Other Ambulatory Visit: Payer: Self-pay

## 2019-05-31 ENCOUNTER — Ambulatory Visit (INDEPENDENT_AMBULATORY_CARE_PROVIDER_SITE_OTHER): Payer: 59 | Admitting: Allergy & Immunology

## 2019-05-31 DIAGNOSIS — J449 Chronic obstructive pulmonary disease, unspecified: Secondary | ICD-10-CM

## 2019-05-31 DIAGNOSIS — J3089 Other allergic rhinitis: Secondary | ICD-10-CM

## 2019-05-31 DIAGNOSIS — J679 Hypersensitivity pneumonitis due to unspecified organic dust: Secondary | ICD-10-CM | POA: Diagnosis not present

## 2019-05-31 DIAGNOSIS — K219 Gastro-esophageal reflux disease without esophagitis: Secondary | ICD-10-CM | POA: Diagnosis not present

## 2019-05-31 DIAGNOSIS — J302 Other seasonal allergic rhinitis: Secondary | ICD-10-CM

## 2019-05-31 MED ORDER — MONTELUKAST SODIUM 10 MG PO TABS: 10.0000 mg | ORAL_TABLET | Freq: Every day | ORAL | 5 refills | Status: DC

## 2019-05-31 MED ORDER — AZELASTINE-FLUTICASONE 137-50 MCG/ACT NA SUSP: 1.0000 | Freq: Two times a day (BID) | NASAL | 5 refills | Status: DC

## 2019-05-31 NOTE — Progress Notes (Signed)
RE: Theresa Franco MRN: [id number] DOB: February 24, 1961 Date of Telemedicine Visit: 05/31/2019  Referring provider: Olena Mater, MD Primary care provider: Olena Mater, MD  Chief Complaint: Shortness of Breath   Telemedicine Follow Up Visit via Telephone: I connected with Theresa Franco for a follow up on 05/31/19 by telephone and verified that I am speaking with the correct person using two identifiers.   I discussed the limitations, risks, security and privacy concerns of performing an evaluation and management service by telephone and the availability of in person appointments. I also discussed with the patient that there may be a patient responsible charge related to this service. The patient expressed understanding and agreed to proceed.  Patient is at home.  Provider is at the office.  Visit start time: 10:35 AM Visit end time: 11:04 AM Insurance consent/check in by: [facility] consent and medical assistant/nurse: Theresa Franco  History of Present Illness:  She is a 58 y.o. female, who is being followed for asthma COPD overlap as well as seasonal and perennial allergic rhinitis. Her previous allergy office visit was in November 2019 with Theresa Franco.  At that visit, her lung testing looks slightly better but was not normal.  We felt that the combination of medications was working well.  We continued her on Dupixent every 2 weeks, Singulair 10 mg daily, and Trelegy 1 puff once daily.  For her allergic rhinitis, we continued Zyrtec as well as Dymista.  We also discussed allergen immunotherapy as a means of long-term control.  For her reflux, we continued 40 mg  Since the last visit, she has mostly done well. She has been out of work since March 2020. She had an episode of bronchitis and then she was written out of work. She is unsure how she will go back to work with the mask and shield. She seems him on Friday. They are planning to get another chest CT to follow up on the  abnormalities noted in 2019 (air trapping, ground glass opacities, pulmonary nodules, consistent with subacute hypersensitivity pneumonitis).   Asthma/Respiratory Symptom History: She remains on the Trelegy one puff once daily as well as nebulizer daily. She does not use her nebulizer daily, mostly around twice per week. She actually stopped the Dupixent because she did not feel that it was making a big difference in her breathing. She did give it a good shot but did not feel that it was doing much. She got six doses in. She did have have prednisone in March for her bronchitis flare.   Allergic Rhinitis Symptom History: She is on Dymista, cetirizine, and Singulair. She tells me that her allergies are under fairly good control. She is happy with the Dymista and feels that there has been a difference. Her cough and postnasal drip seems to be the worse in the mornings. Her Dymista has been changed to the generic form.   Otherwise, there have been no changes to her past medical history, surgical history, family history, or social history.  Assessment and Plan:  Theresa Franco is a 58 y.o. 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 Severe persistent asthma, uncomplicated - Lung testing looked slightly better today, but is certainly  not normal.  - But it seems that the combination is working very well.  - Daily controller medication(s): Singulair 10mg  daily and Trelegy 100/62.5/25 one puff once daily - Prior to physical activity: ProAir 2 puffs 10-15 minutes before physical activity. - Rescue  medications: DuoNeb nebulizer one vial every 4-6 hours as needed - Asthma control goals:  * Full participation in all desired activities (may need albuterol before activity) * Albuterol use two time or less a week on average (not counting use with activity) * Cough interfering with sleep two time or less a month * Oral steroids no more than once a year * No hospitalizations  2. Seasonal and perennial allergic rhinitis (grasses, weeds, trees, dust mite, cat, dog, cockroach) - It seems that the current regimen is working very well, so we can avoid allergy shots.  - Continue with: Zyrtec (cetirizine) 10mg  tablet once daily and Dymista (fluticasone/azelastine) two sprays per nostril 1-2 times daily as needed - You can use an extra dose of the antihistamine, if needed, for breakthrough symptoms.  - Consider nasal saline rinses 1-2 times daily to remove allergens from the nasal cavities as well as help with mucous clearance (this is especially helpful to do before the nasal sprays are given).  3. Gastroesophageal reflux disease - Continue with omeprazole 40mg  daily.  4. Return in about 6 months (around 11/30/2019).    Diagnostics: None.  Medication List:  Current Outpatient Medications  Medication Sig Dispense Refill  . albuterol (PROAIR HFA) 108 (90 Base) MCG/ACT inhaler Inhale 2 puffs into the lungs every 6 (six) hours as needed for wheezing or shortness of breath. 1 Inhaler 1  . ALPRAZolam (XANAX) 0.5 MG tablet Take 0.5 mg by mouth 3 (three) times daily as needed for anxiety.     . Azelastine-Fluticasone (DYMISTA) 137-50 MCG/ACT SUSP Place 1 spray into the nose 2 (two) times daily for 30 days. 23 g 5  . buPROPion (WELLBUTRIN SR) 150 MG 12 hr tablet Take 150 mg by mouth 2 (two) times daily.    . cetirizine (ZYRTEC) 10 MG tablet Take 1 tablet (10 mg total) by mouth daily. 30 tablet 5  . Fluticasone-Umeclidin-Vilant (TRELEGY ELLIPTA) 100-62.5-25 MCG/INH AEPB Inhale 1 puff into the lungs  daily. 60 each 5  . HYDROcodone-homatropine (HYCODAN) 5-1.5 MG/5ML syrup Take 5 mLs by mouth every 6 (six) hours as needed for cough. 240 mL 0  . ibuprofen (ADVIL,MOTRIN) 200 MG tablet Take 400 mg by mouth every 6 (six) hours as needed for headache or moderate pain.    Marland Kitchen ipratropium-albuterol (DUONEB) 0.5-2.5 (3) MG/3ML SOLN Take 3 mLs by nebulization every 6 (six) hours as needed. (Patient taking differently: Take 3 mLs by nebulization every 6 (six) hours as needed (for shortness of breath or wheezing). ) 360 mL 3  . losartan (COZAAR) 50 MG tablet Take 50 mg by mouth daily.  0  . montelukast (SINGULAIR) 10 MG tablet Take 1 tablet (10 mg total) by mouth daily. 30 tablet 5  . omeprazole (PRILOSEC) 40 MG capsule TAKE 1 CAPSULE BY MOUTH EVERY DAY 90 capsule 2  . Respiratory Therapy Supplies (FLUTTER) DEVI Use as directed. 1 each 0   No current facility-administered medications for this visit.    Allergies: Allergies  Allergen Reactions  . Amlodipine Swelling and Other (See Comments)    ankles  . Xolair [Omalizumab] Other (See Comments)    BP increased   I reviewed her past medical history, social history, family history, and environmental history and  no significant changes have been reported from previous visits.  Review of Systems  Constitutional: Negative for activity change, appetite change, chills, fatigue and fever.  HENT: Negative for congestion, postnasal drip, rhinorrhea, sinus pressure and sore throat.   Eyes: Negative for pain, discharge, redness and itching.  Respiratory: Positive for cough. Negative for chest tightness, shortness of breath, wheezing and stridor.   Gastrointestinal: Negative for blood in stool, constipation, diarrhea, nausea and vomiting.  Endocrine: Negative for cold intolerance and heat intolerance.  Musculoskeletal: Negative for arthralgias, joint swelling and myalgias.  Skin: Negative for rash.  Allergic/Immunologic: Negative for environmental allergies,  food allergies and immunocompromised state.    Objective:  Physical exam not obtained as encounter was done via telephone.   Previous notes and tests were reviewed.  I discussed the assessment and treatment plan with the patient. The patient was provided an opportunity to ask questions and all were answered. The patient agreed with the plan and demonstrated an understanding of the instructions.   The patient was advised to call back or seek an in-person evaluation if the symptoms worsen or if the condition fails to improve as anticipated.  I provided 29 minutes of non-face-to-face time during this encounter.  It was my pleasure to participate in [place] care today. Please feel free to contact me with any questions or concerns.   Sincerely,  Valentina Shaggy, MD

## 2019-05-31 NOTE — Patient Instructions (Addendum)
1. Severe persistent asthma, uncomplicated - Lung testing looked slightly better today, but is certainly not normal.  - But it seems that the combination is working very well.  - Daily controller medication(s): Singulair 10mg  daily and Trelegy 100/62.5/25 one puff once daily - Prior to physical activity: ProAir 2 puffs 10-15 minutes before physical activity. - Rescue medications: DuoNeb nebulizer one vial every 4-6 hours as needed - Asthma control goals:  * Full participation in all desired activities (may need albuterol before activity) * Albuterol use two time or less a week on average (not counting use with activity) * Cough interfering with sleep two time or less a month * Oral steroids no more than once a year * No hospitalizations  2. Seasonal and perennial allergic rhinitis (grasses, weeds, trees, dust mite, cat, dog, cockroach) - It seems that the current regimen is working very well, so we can avoid allergy shots.  - Continue with: Zyrtec (cetirizine) 10mg  tablet once daily and Dymista (fluticasone/azelastine) two sprays per nostril 1-2 times daily as needed - You can use an extra dose of the antihistamine, if needed, for breakthrough symptoms.  - Consider nasal saline rinses 1-2 times daily to remove allergens from the nasal cavities as well as help with mucous clearance (this is especially helpful to do before the nasal sprays are given).  3. Gastroesophageal reflux disease - Continue with omeprazole 40mg  daily.  4. Return in about 6 months (around 11/30/2019).   Please inform us of any Emergency Department visits, hospitalizations, or changes in symptoms. Call us before going to the ED for breathing or allergy symptoms since we might be able to fit you in for a sick visit. Feel free to contact us anytime with any questions, problems, or concerns.  It was a pleasure to see you again today!  Websites that have reliable patient information: 1. American Academy of Asthma,  Allergy, and Immunology: www.aaaai.org 2. Food Allergy Research and Education (FARE): foodallergy.org 3. Mothers of Asthmatics: http://www.asthmacommunitynetwork.org 4. American College of Allergy, Asthma, and Immunology: MonthlyElectricBill.co.uk   Make sure you are registered to vote!

## 2019-06-02 ENCOUNTER — Other Ambulatory Visit: Payer: Self-pay

## 2019-06-02 ENCOUNTER — Ambulatory Visit (INDEPENDENT_AMBULATORY_CARE_PROVIDER_SITE_OTHER): Payer: 59 | Admitting: Pulmonary Disease

## 2019-06-02 ENCOUNTER — Encounter: Payer: Self-pay | Admitting: *Deleted

## 2019-06-02 ENCOUNTER — Encounter: Payer: Self-pay | Admitting: Pulmonary Disease

## 2019-06-02 DIAGNOSIS — R059 Cough, unspecified: Secondary | ICD-10-CM

## 2019-06-02 DIAGNOSIS — R05 Cough: Secondary | ICD-10-CM | POA: Diagnosis not present

## 2019-06-02 MED ORDER — HYDROCODONE-HOMATROPINE 5-1.5 MG/5ML PO SYRP: 5.0000 mL | ORAL_SOLUTION | Freq: Four times a day (QID) | ORAL | 0 refills | Status: DC | PRN

## 2019-06-02 NOTE — Patient Instructions (Signed)
Continue inhalers as prescribed We will give a letter stating it is okay to go back to work for half a day for the next 3 months We will review at the end of 3 months.  Return to clinic in 3 months

## 2019-06-02 NOTE — Progress Notes (Signed)
Theresa Franco    [id number]    1961-06-29  Primary Care Physician:Zimmer, [name], MD  Referring Physician: Olena Mater, [place] [place] [address]  Chief complaint: Follow up for cough, COPD, asthma.  HPI: 58 year old with past medical history of hypertension, asthma, allergies.  She has history of chronic cough for several years.  She is evaluated by Dr. [name], Pulmonologist at [place], [place].  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  She was given Xolair but had to stop after 1 dose as she developed severe hypertension.  She has been tried on breo, trelegy without any improvement in symptoms.  Her peripheral blood count does not show any eosinophilia and she was not a candidate for anti-IL5 therapy.  As per the patient she underwent a bronchoscopy in September 2018 to evaluate for endobronchial lesions which did not show any abnormalities. Medications significant for losartan.  She had been off this for > 1 year with no change in cough.  It had to be restarted after she developed hypertension after Xolair.  She has cough which is mostly nonproductive in nature.  This is more at night and sometimes keeps her up.  She has dyspnea with exertion.  Denies dyspnea at rest, no wheezing, fevers, chills.  Denies any heartburn symptoms.  Followed by Dr. [name], Allergy.  Started on [place] therapy in late 2019 but stopped after a few doses as it did not make a difference CT in April 2019 shows centrilobular nodules for which she underwent bronchoscopy.  Results are nondiagnostic.  Pets: 2 dogs but no birds, cats Occupation: Medical lab tech Exposures: No exposure, no mold at home Smoking history: 10-pack-year history.  Quit in 1993. Travel History: Not significant  Interim history: Seen in clinic earlier this month for COPD exacerbation.  Treated with antibiotics and steroids.  She has continued to improve symptomatically.  Complains of  chronic dyspnea on exertion. Continues on Trelegy inhaler with stable lung symptoms.  Outpatient Encounter Medications as of 06/02/2019  Medication Sig  . albuterol (PROAIR HFA) 108 (90 Base) MCG/ACT inhaler Inhale 2 puffs into the lungs every 6 (six) hours as needed for wheezing or shortness of breath.  . ALPRAZolam (XANAX) 0.5 MG tablet Take 0.5 mg by mouth 3 (three) times daily as needed for anxiety.   . Azelastine-Fluticasone (DYMISTA) 137-50 MCG/ACT SUSP Place 1 spray into the nose 2 (two) times daily for 30 days.  Marland Kitchen buPROPion (WELLBUTRIN SR) 150 MG 12 hr tablet Take 150 mg by mouth 2 (two) times daily.  . cetirizine (ZYRTEC) 10 MG tablet Take 1 tablet (10 mg total) by mouth daily.  . Fluticasone-Umeclidin-Vilant (TRELEGY ELLIPTA) 100-62.5-25 MCG/INH AEPB Inhale 1 puff into the lungs daily.  Marland Kitchen HYDROcodone-homatropine (HYCODAN) 5-1.5 MG/5ML syrup Take 5 mLs by mouth every 6 (six) hours as needed for cough.  Marland Kitchen ibuprofen (ADVIL,MOTRIN) 200 MG tablet Take 400 mg by mouth every 6 (six) hours as needed for headache or moderate pain.  Marland Kitchen ipratropium-albuterol (DUONEB) 0.5-2.5 (3) MG/3ML SOLN Take 3 mLs by nebulization every 6 (six) hours as needed. (Patient taking differently: Take 3 mLs by nebulization every 6 (six) hours as needed (for shortness of breath or wheezing). )  . losartan (COZAAR) 50 MG tablet Take 50 mg by mouth daily.  . montelukast (SINGULAIR) 10 MG tablet Take 1 tablet (10 mg total) by mouth daily.  Marland Kitchen omeprazole (PRILOSEC) 40 MG capsule TAKE 1 CAPSULE BY  MOUTH EVERY DAY  . Respiratory Therapy Supplies (FLUTTER) DEVI Use as directed.   No facility-administered encounter medications on file as of 06/02/2019.    Physical Exam: Blood pressure 126/78, pulse (!) 106, temperature 98.2 F (36.8 C), temperature source Oral, height _0  (1.753 m), weight 166 lb 9.6 oz (75.6 kg), SpO2 98 %. Gen:      No acute distress HEENT:  EOMI, sclera anicteric Neck:     No masses; no thyromegaly  Lungs:    Clear to auscultation bilaterally; normal respiratory effort CV:         Regular rate and rhythm; no murmurs Abd:      + bowel sounds; soft, non-tender; no palpable masses, no distension Ext:    No edema; adequate peripheral perfusion Skin:      Warm and dry; no rash Neuro: alert and oriented x 3 Psych: normal mood and affect  Data Reviewed: Imaging: Chest x-ray 11/18/17- no active cardiopulmonary disease.   High-resolution CT 03/10/2018-subtle centrilobular groundglass opacities, air trapping in the upper lobes.  Tiny pulmonary nodules the largest is 3 mm. I have reviewed the images personally.  PFTs: Spirometry from [place], [place] 03/10/17 FVC 2.71 [71%], pre-FEV1 1.53 [54%], post FEV1 1.70 [59%], % change 11 F/F 63 ABG 04/07/2017-7.4 2/40/73 Moderate obstruction with improvement in flow rates especially mid flows post bronchodilator suggestive of small airways disease.  03/03/18 FVC 1.70 (41%], FEV1 1.00 (31%], F/F 59, TLC 101, RV/TLC 173%, DLCO 67% Severe obstruction with air trapping, moderate diffusion defect   11/11/18 FVC 2.51 [61%], FEV1 1.51 [47%], F/F 60, TLC 100%, DLCO 82% Severe obstruction.  FENO 11/18/17-13 FENO 01/06/18-14 FENO 04/04/2018-7  Labs: CBC 11/18/17-WBC 4.7, eosinophils 1.1%, absolute eos count 100 RAST panel 11/18/17-IgE 752, sensitive to multiple allergens including cats, dogs, dust mite, pollen  Alpha-1 antitrypsin 03/03/2018-204, PI FM Connective tissue serologies 04/04/2018-ANA, rheumatoid factor, CCP, hypersensitivity panel, double-stranded DNA, Ro, lab, SCL 70- negative  Bronchoscopy 04/15/2018 WBC 525, 11% lymphs, 89% monocyte macrophage Cultures- Negative Cytology-benign.  Path- benign lung and bronchial mucosa.  Assessment:  Severe COPD, asthmatic bronchitis PFTs reviewed with severe obstruction, air trapping.   Continue Trelegy inhaler.  She is concerned about returning to work as she feels suffocated with the mask and is  afraid of contacting [place].  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  Allergic rhinitis, asthma Being followed at the allergy clinic by Dr. [name].    GERD On omeprazole  Abnormal CT scan CT scan reviewed with mild centrilobular nodules, air trapping.  Bronchoscopy and labs for connective tissue disease, hypersensitivity pneumonitis are negative There is no evidence of lymphocytosis or eosinophilia on BAL.  At this point I do not have a clear evidence of interstitial lung disease.  I suspect that the findings on CT scan may have been nonspecific inflammatory findings since the scan was done as she was recovering from an exacerbation.  We will continue to monitor this with repeat CT scan later this year  Health maintenance 10/06/2018-influenza 04/04/2018-Pneumovax  Plan/Recommendations: - Continue trelegy, duo nebs as needed - Hycodan cough syrup - Follow up high res CT  [name] MD [facility] 06/02/2019, 2:49 PM  CC: Olena Mater, MD

## 2019-06-05 ENCOUNTER — Ambulatory Visit: Payer: 59 | Admitting: Acute Care

## 2019-06-05 ENCOUNTER — Telehealth: Payer: Self-pay | Admitting: Pulmonary Disease

## 2019-06-05 NOTE — Telephone Encounter (Signed)
Attempted to call pt but unable to reach. Left message for pt to return call. 

## 2019-06-06 NOTE — Telephone Encounter (Signed)
Attempt to call x2.  Left voicemail to return call.

## 2019-06-06 NOTE — Telephone Encounter (Signed)
No forms found in Dr. Matilde Bash mailbox and verified with Salome Arnt that she did not have any new forms.

## 2019-06-07 NOTE — Telephone Encounter (Signed)
Attempted to call pt but unable to reach. Left message for pt to return call. Due to multiple attempts trying to reach pt and unable to do so, per triage protocol encounter will be closed. 

## 2019-06-12 ENCOUNTER — Encounter: Payer: Self-pay | Admitting: Pulmonary Disease

## 2019-06-12 NOTE — Telephone Encounter (Signed)
Pt sent MyChart message inquiring about her return to work note she was supposed to receive at her recent office visit w/ Dr. Vaughan Browner 06/02/2019. I sent her a respond MyChart message letting her know we would be getting this taken care of for her.   I have printed letter for pt w/ Dr. Matilde Bash signature. I have sent it through fax [phone]. Nothing further needed at this time.

## 2019-06-21 ENCOUNTER — Telehealth: Payer: Self-pay | Admitting: Pulmonary Disease

## 2019-06-21 NOTE — Telephone Encounter (Signed)
Called & spoke w/ pt. Pt states she called [employer] today inquiring if they received her signed FMLA paperwork from our office and [employer] stated they did not receive anything yet. I let pt know I would check Dr. Matilde Bash incoming mail and with our lead front staff employee to see if we have those papers. Pt expressed understanding.  I checked Dr. Matilde Bash mailbox and did find any forms for this pt. I also checked with Sharl Ma, who also did not have any paperwork for the pt. Patrice recommended I call the pt to make her aware of this and to recommend to have her employer fax her disability forms directly to [employer] [phone].   I called pt & let her know of this. Pt verbalized understanding and stated she had attempted to have her paperwork directly faxed to [employer] before without success; however, she states it may have been the particular employee she was talking to that day. She states she will try that again and let us know if it does not work out. I verified [employer]'s fax number with pt. Pt expressed understanding. Nothing further needed at this time.

## 2019-07-04 ENCOUNTER — Telehealth: Payer: Self-pay | Admitting: Pulmonary Disease

## 2019-07-06 ENCOUNTER — Ambulatory Visit: Payer: BC Managed Care – PPO | Admitting: Pulmonary Disease

## 2019-07-06 ENCOUNTER — Other Ambulatory Visit: Payer: Self-pay | Admitting: *Deleted

## 2019-07-06 DIAGNOSIS — R05 Cough: Secondary | ICD-10-CM

## 2019-07-06 DIAGNOSIS — R059 Cough, unspecified: Secondary | ICD-10-CM

## 2019-07-06 MED ORDER — HYDROCODONE-HOMATROPINE 5-1.5 MG/5ML PO SYRP: 5.0000 mL | ORAL_SOLUTION | Freq: Four times a day (QID) | ORAL | 0 refills | Status: DC | PRN

## 2019-07-06 NOTE — Telephone Encounter (Signed)
Dr. Vaughan Browner has signed the disability forms and they have been returned to [facility]

## 2019-07-06 NOTE — Telephone Encounter (Signed)
5:07 PM Franco, Theresa, Theresa Franco routed this conversation to [facility] Triage Pool   Theresa Franco, Theresa Franco     5:07 PM Note   Ok to refill cough medication        5:02 PM You routed this conversation to Theresa Franco, Theresa Franco   Theresa Franco to Theresa Franco, Theresa Franco      4:40 PM Can you check on my refill request?  Thank you.     4:36 PM You routed this conversation to Theresa Franco, Theresa Franco   Theresa Franco to Theresa Franco, Theresa Franco      3:57 PM [address] in [place].  Thanks so much.     10:38 AM You routed this conversation to Theresa Franco, Theresa Franco   Me     10:38 AM Note   Dr Vaughan Browner,  Pt is requesting a refill on her hycodan  Her msg states:  I have occasional sob. My cough is very bad when I get up on the morning and start moving around. Occasionally productive ( I feel I need to get something up). No fever or wheezing. Yes, I have tried OTC. Dr. Vaughan Browner said that he would continue to prescribe this cough medicine as long as I wasn't abusing it. I try to only take it when I really need it. It's embarrassing to cough so much in public  Looks like she had this filled last #240 ml 06/02/2019  She uses the [facility] in [place], [place]  Thank you!    Theresa Franco to Theresa Franco, Theresa Franco      10:06 AM [address] [place], [place].  Thank you. Me to Theresa Franco      10:05 AM OK thank you, what pharmacy would you like to you and I will send your request to Dr Vaughan Browner, thanks  Last read by Theresa Franco at 4:40 PM on 07/06/2019.     10:03 AM  (Name not recorded) contacted Me   Theresa Franco to Theresa Franco, Theresa Franco      8:59 AM I have occasional sob.  My cough is very bad when I get up on the morning and start moving around.  Occasionally productive ( I feel I need to get something up).  No fever or wheezing.  Yes, I have tried OTC. Dr. Vaughan Browner said that he would continue to prescribe this cough medicine as long as I wasn't abusing it.   I try to only take it when I really need it.  It's embarrassing to cough so much in public. Me to Theresa Franco      8:52 AM I understand your cough is ongoing. Are you having other symptoms with it? Are you producing mucus? Fever? Wheezing? Have you tried any over the counter cough medications yet? Please advise, thank you.   Last read by Theresa Franco at 9:15 AM on 07/06/2019. July 05, 2019 Theresa Franco to Theresa Franco, Theresa Franco      5:24 PM My cough is chronic.  Dr. Vaughan Browner is currently treating me for stage 3 COPD.   Theresa Sin, LPN to Theresa Franco      4:49 PM [name],  We are going to need a little more information.  Has your cough gotten worse and do  you have any other symptoms? Are you having a dry cough or productive, and if it is productive, what color is your phlegm?  Dr Vaughan Browner will be in the office tomorrow, in case you feel you need a office visit/telephone visit.  Thank you  Last read by Theresa Franco at 8:49 AM on 07/06/2019. Theresa Franco to Theresa Franco, Theresa Franco      4:26 PM Would you please ask Dr. Vaughan Browner to refill my prescription cough medicine?  Thank you so much.

## 2019-07-06 NOTE — Telephone Encounter (Deleted)
Dr Vaughan Browner,  Pt is requesting refill on Hy

## 2019-07-06 NOTE — Telephone Encounter (Signed)
Ok to refill cough medication

## 2019-07-06 NOTE — Telephone Encounter (Signed)
Dr Vaughan Browner,  Pt is requesting a refill on her hycodan  Her msg states:  I have occasional sob.  My cough is very bad when I get up on the morning and start moving around.  Occasionally productive ( I feel I need to get something up).  No fever or wheezing.  Yes, I have tried OTC. Dr. Vaughan Browner said that he would continue to prescribe this cough medicine as long as I wasn't abusing it.  I try to only take it when I really need it.  It's embarrassing to cough so much in public  Looks like she had this filled last #240 ml 06/02/2019  She uses the [facility] in [place], [place]  Thank you!

## 2019-07-07 NOTE — Telephone Encounter (Signed)
Rec'd completed disability forms. Fwd to [employer] via interoffice mail -pr  °

## 2019-08-23 DIAGNOSIS — R05 Cough: Secondary | ICD-10-CM

## 2019-08-23 DIAGNOSIS — R059 Cough, unspecified: Secondary | ICD-10-CM

## 2019-08-23 NOTE — Telephone Encounter (Signed)
Dr. Vaughan Browner, please see below message from patient asking for refill of Hycodan.  Thank you!    Would you ask Dr. Vaughan Browner to refill my prescription cough medicine?  My cough has not changed.  It is occasionally productive.  He is treating me for chronic cough and stage 3 COPD.  I have a f/u on 10/1.  Thank you.

## 2019-08-28 NOTE — Telephone Encounter (Signed)
Ok to refill 

## 2019-08-28 NOTE — Telephone Encounter (Signed)
Dr. Vaughan Browner, the patient is writing back via email asking if you have had a chance to review her refill request for Hycodan. Please advise. Thank you!

## 2019-08-29 NOTE — Addendum Note (Signed)
Addended by: Annie Paras D on: 08/29/2019 01:52 PM   Modules accepted: Orders

## 2019-08-29 NOTE — Telephone Encounter (Addendum)
Dr. Vaughan Browner, please advise on the refill of this medication. Since hycodan is a controlled substance we will need you to sign off on this order. I have pended the order for you to sign. Thank you.

## 2019-08-30 MED ORDER — HYDROCODONE-HOMATROPINE 5-1.5 MG/5ML PO SYRP: 5.0000 mL | ORAL_SOLUTION | Freq: Four times a day (QID) | ORAL | 0 refills | Status: DC | PRN

## 2019-09-01 ENCOUNTER — Other Ambulatory Visit: Payer: Self-pay

## 2019-09-01 ENCOUNTER — Ambulatory Visit (HOSPITAL_COMMUNITY)
Admission: RE | Admit: 2019-09-01 | Discharge: 2019-09-01 | Disposition: A | Discharge status: Home / Self Care | Payer: 59 | Source: Ambulatory Visit | Attending: Pulmonary Disease | Admitting: Pulmonary Disease

## 2019-09-01 DIAGNOSIS — J849 Interstitial pulmonary disease, unspecified: Secondary | ICD-10-CM | POA: Insufficient documentation

## 2019-09-07 ENCOUNTER — Ambulatory Visit (INDEPENDENT_AMBULATORY_CARE_PROVIDER_SITE_OTHER): Payer: Managed Care, Other (non HMO) | Admitting: Pulmonary Disease

## 2019-09-07 ENCOUNTER — Encounter: Payer: Self-pay | Admitting: Pulmonary Disease

## 2019-09-07 ENCOUNTER — Other Ambulatory Visit: Payer: Self-pay

## 2019-09-07 VITALS — BP 120/82 | HR 78 | Temp 98.3°F | Ht 69.0 in | Wt 169.4 lb

## 2019-09-07 DIAGNOSIS — J849 Interstitial pulmonary disease, unspecified: Secondary | ICD-10-CM | POA: Diagnosis not present

## 2019-09-07 DIAGNOSIS — G4733 Obstructive sleep apnea (adult) (pediatric): Secondary | ICD-10-CM

## 2019-09-07 DIAGNOSIS — Z23 Encounter for immunization: Secondary | ICD-10-CM

## 2019-09-07 NOTE — Patient Instructions (Signed)
I am glad you are stable with regard to your breathing Continue Trelegy inhaler. It is okay to return back to work full-time.  We will give a letter stating this We will check some labs today including comprehensive metabolic panel, alpha-1 antitrypsin levels Your CT scan shows stable lung nodule.  Will order a follow-up CT without contrast in 1 year.  Follow-up in 6 months.

## 2019-09-07 NOTE — Progress Notes (Addendum)
Theresa Franco    [id number]    [birth year]  Primary Care Physician:Zimmer, [name], MD  Referring Physician: Olena Mater, [place] [address]  Chief complaint: Follow up for cough, COPD, asthma.  HPI: 58 year old with past medical history of hypertension, asthma, allergies.  She has history of chronic cough for several years.  She is evaluated by Dr. [name], Pulmonologist at [place], [place].  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  She was given Xolair but had to stop after 1 dose as she developed severe hypertension.  She has been tried on breo, trelegy without any improvement in symptoms.  Her peripheral blood count does not show any eosinophilia and she was not a candidate for anti-IL5 therapy.  As per the patient she underwent a bronchoscopy in September 2018 to evaluate for endobronchial lesions which did not show any abnormalities. Medications significant for losartan.  She had been off this for > 1 year with no change in cough.  It had to be restarted after she developed hypertension after Xolair.  She has cough which is mostly nonproductive in nature.  This is more at night and sometimes keeps her up.  She has dyspnea with exertion.  Denies dyspnea at rest, no wheezing, fevers, chills.  Denies any heartburn symptoms.  Followed by Dr. [name], Allergy.  Started on [place] therapy in late 2019 but stopped after a few doses as it did not make a difference CT in April 2019 shows centrilobular nodules for which she underwent bronchoscopy.  Results are nondiagnostic.  Pets: 2 dogs but no birds, cats Occupation: Medical lab tech Exposures: No exposure, no mold at home Smoking history: 10-pack-year history.  Quit in 1993. Travel History: Not significant  Interim history: She had a COPD exacerbation in early 2020 which was treated with antibiotics and steroids Breathing has been stable since then with no issues She has returned  back to work for half a day and is doing well.  She feels she is ready to work full day Continues on Trelegy inhaler.  Has husband has told her that she is having more snoring at night with witnessed apneas.  Outpatient Encounter Medications as of 09/07/2019  Medication Sig  . albuterol (PROAIR HFA) 108 (90 Base) MCG/ACT inhaler Inhale 2 puffs into the lungs every 6 (six) hours as needed for wheezing or shortness of breath.  . ALPRAZolam (XANAX) 0.5 MG tablet Take 0.5 mg by mouth 3 (three) times daily as needed for anxiety.   . Azelastine-Fluticasone (DYMISTA) 137-50 MCG/ACT SUSP Place 1 spray into the nose 2 (two) times daily for 30 days.  Marland Kitchen buPROPion (WELLBUTRIN SR) 150 MG 12 hr tablet Take 150 mg by mouth 2 (two) times daily.  . cetirizine (ZYRTEC) 10 MG tablet Take 1 tablet (10 mg total) by mouth daily.  . Fluticasone-Umeclidin-Vilant (TRELEGY ELLIPTA) 100-62.5-25 MCG/INH AEPB Inhale 1 puff into the lungs daily.  Marland Kitchen HYDROcodone-homatropine (HYCODAN) 5-1.5 MG/5ML syrup Take 5 mLs by mouth every 6 (six) hours as needed for cough.  Marland Kitchen ibuprofen (ADVIL,MOTRIN) 200 MG tablet Take 400 mg by mouth every 6 (six) hours as needed for headache or moderate pain.  Marland Kitchen ipratropium-albuterol (DUONEB) 0.5-2.5 (3) MG/3ML SOLN Take 3 mLs by nebulization every 6 (six) hours as needed. (Patient taking differently: Take 3 mLs by nebulization every 6 (six) hours as needed (for shortness of breath or wheezing). )  . losartan (COZAAR) 50 MG tablet Take  50 mg by mouth daily.  . montelukast (SINGULAIR) 10 MG tablet Take 1 tablet (10 mg total) by mouth daily.  Marland Kitchen omeprazole (PRILOSEC) 40 MG capsule TAKE 1 CAPSULE BY MOUTH EVERY DAY  . Respiratory Therapy Supplies (FLUTTER) DEVI Use as directed.   No facility-administered encounter medications on file as of 09/07/2019.    Physical Exam: Blood pressure 120/82, pulse 78, temperature 98.3 F (36.8 C), temperature source Temporal, height _0  (1.753 m), weight 169 lb 6.4 oz  (76.8 kg), SpO2 97 %. Gen:      No acute distress HEENT:  EOMI, sclera anicteric Neck:     No masses; no thyromegaly Lungs:    Clear to auscultation bilaterally; normal respiratory effort CV:         Regular rate and rhythm; no murmurs Abd:      + bowel sounds; soft, non-tender; no palpable masses, no distension Ext:    No edema; adequate peripheral perfusion Skin:      Warm and dry; no rash Neuro: alert and oriented x 3 Psych: normal mood and affect  Data Reviewed: Imaging: High-resolution CT 03/10/2018-subtle centrilobular groundglass opacities, air trapping in the upper lobes.  Tiny pulmonary nodules the largest is 3 mm.  High-res CT 09/01/2019- resolution of groundglass opacities.  Pulmonary nodules are stable. I have reviewed the images personally.  PFTs: Spirometry from [place], [place] 03/10/17 FVC 2.71 [71%], pre-FEV1 1.53 [54%], post FEV1 1.70 [59%], % change 11 F/F 63 ABG 04/07/2017-7.4 2/40/73 Moderate obstruction with improvement in flow rates especially mid flows post bronchodilator suggestive of small airways disease.  03/03/18 FVC 1.70 (41%], FEV1 1.00 (31%], F/F 59, TLC 101, RV/TLC 173%, DLCO 67% Severe obstruction with air trapping, moderate diffusion defect   11/11/18 FVC 2.51 [61%], FEV1 1.51 [47%], F/F 60, TLC 100%, DLCO 82% Severe obstruction.  FENO 11/18/17-13 FENO 01/06/18-14 FENO 04/04/2018-7  Labs: CBC 11/18/17-WBC 4.7, eosinophils 1.1%, absolute eos count 100 RAST panel 11/18/17-IgE 752, sensitive to multiple allergens including cats, dogs, dust mite, pollen  Alpha-1 antitrypsin 03/03/2018-204, PI FM Connective tissue serologies 04/04/2018-ANA, rheumatoid factor, CCP, hypersensitivity panel, double-stranded DNA, Ro, lab, SCL 70- negative  Bronchoscopy 04/15/2018 WBC 525, 11% lymphs, 89% monocyte macrophage Cultures- Negative Cytology-benign.  Path- benign lung and bronchial mucosa.  Assessment:  Severe COPD, asthmatic bronchitis PFTs reviewed with  severe obstruction, air trapping.   Continue Trelegy inhaler.  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  I told her it is okay to return to full-time work and will give her a letter stating this.  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  Repeat alpha-1 antitrypsin levels.  Allergic rhinitis, asthma Being followed at the allergy clinic by Dr. [name].    GERD On omeprazole  Abnormal CT scan CT scan in 2019 reviewed with mild centrilobular nodules, air trapping.  Bronchoscopy and labs for connective tissue disease, hypersensitivity pneumonitis are negative There is no evidence of lymphocytosis or eosinophilia on BAL.  At this point I do not have a clear evidence of interstitial lung disease.  I suspect that the findings on CT scan may have been nonspecific inflammatory findings since the scan was done as she was recovering from an exacerbation.  Repeat CT shows resolution of these abnormalities  Lung nodules Follow-up CT in 1 year.  Suspected sleep apnea Schedule home sleep study.  Health maintenance Flu vaccine today 05/04/2018-Pneumovax  Plan/Recommendations: - Continue trelegy, duo nebs as needed - CT chest in 1 year.  - Home Sleep study. - Flu vaccine  [name] MD [facility] 09/07/2019, 2:01 PM  CC: Olena Mater, MD

## 2019-09-07 NOTE — Addendum Note (Signed)
Addended by: Hildred Alamin I on: 09/07/2019 02:22 PM   Modules accepted: Orders

## 2019-09-15 ENCOUNTER — Ambulatory Visit: Payer: Managed Care, Other (non HMO)

## 2019-09-15 ENCOUNTER — Other Ambulatory Visit: Payer: Self-pay

## 2019-09-15 DIAGNOSIS — R0683 Snoring: Secondary | ICD-10-CM | POA: Diagnosis not present

## 2019-09-15 DIAGNOSIS — G4733 Obstructive sleep apnea (adult) (pediatric): Secondary | ICD-10-CM

## 2019-09-18 MED ORDER — TRELEGY ELLIPTA 100-62.5-25 MCG/INH IN AEPB: 1.0000 | INHALATION_SPRAY | Freq: Every day | RESPIRATORY_TRACT | 4 refills | Status: DC

## 2019-09-20 DIAGNOSIS — R0683 Snoring: Secondary | ICD-10-CM | POA: Diagnosis not present

## 2019-09-21 DIAGNOSIS — G2581 Restless legs syndrome: Secondary | ICD-10-CM | POA: Insufficient documentation

## 2019-09-27 DIAGNOSIS — G4734 Idiopathic sleep related nonobstructive alveolar hypoventilation: Secondary | ICD-10-CM

## 2019-09-27 NOTE — Telephone Encounter (Signed)
Patient is requesting her sleep study results.   Dr. Vaughan Browner, please advise. Thanks!

## 2019-09-29 NOTE — Telephone Encounter (Signed)
Patient aware of results, waiting to hear back if she wants to use home oxygen and [facility] of [place] as once used in 2019 before placing O2 order.

## 2019-09-29 NOTE — Telephone Encounter (Signed)
Please let her know there is no evidence of sleep apnea.  She does have low oxygen levels at night Start supplemental oxygen 2 L at night

## 2019-10-02 NOTE — Telephone Encounter (Signed)
Patient wants night oxygen to go to: [facility] in [place].  Order placed. Nothing further needed at this time.

## 2019-10-19 DIAGNOSIS — R059 Cough, unspecified: Secondary | ICD-10-CM

## 2019-10-19 DIAGNOSIS — R05 Cough: Secondary | ICD-10-CM

## 2019-10-19 NOTE — Telephone Encounter (Signed)
Received MyChart message for patient. She stated that she needs a refill on her hycodan cough syrup. Per her chart, she last received a refill on 08/30/2019 for 220mL. She was last seen on 09/07/2019 and advised to follow up in 6 months.   Dr. Vaughan Browner, please advise if you are ok with this refill. Thanks!

## 2019-10-20 MED ORDER — HYDROCODONE-HOMATROPINE 5-1.5 MG/5ML PO SYRP: 5.0000 mL | ORAL_SOLUTION | Freq: Four times a day (QID) | ORAL | 0 refills | Status: DC | PRN

## 2019-10-20 NOTE — Telephone Encounter (Signed)
I have refilled. thanks

## 2019-11-14 MED ORDER — AZELASTINE-FLUTICASONE 137-50 MCG/ACT NA SUSP: 1.0000 | Freq: Two times a day (BID) | NASAL | 5 refills | Status: DC

## 2019-11-22 ENCOUNTER — Ambulatory Visit: Payer: Self-pay | Admitting: Allergy & Immunology

## 2019-11-28 DIAGNOSIS — R059 Cough, unspecified: Secondary | ICD-10-CM

## 2019-11-28 DIAGNOSIS — R05 Cough: Secondary | ICD-10-CM

## 2019-11-28 NOTE — Telephone Encounter (Signed)
Per office policy this can be routed to the primary pulmonologist.  He can be refilled at their discretion.  This should be routed to Dr. Vaughan Browner.  If patient cannot wait to have this evaluated.  And cough is worsening.  Patient can have a virtual visit with an APP or Dr. Vaughan Browner to further evaluate.  Wyn Quaker, FNP

## 2019-11-29 MED ORDER — HYDROCODONE-HOMATROPINE 5-1.5 MG/5ML PO SYRP: 5.0000 mL | ORAL_SOLUTION | Freq: Four times a day (QID) | ORAL | 0 refills | Status: DC | PRN

## 2019-11-29 NOTE — Telephone Encounter (Signed)
Ok to refill. I have sent in the order.

## 2019-12-20 ENCOUNTER — Ambulatory Visit: Payer: Self-pay | Admitting: Allergy & Immunology

## 2020-01-05 DIAGNOSIS — R059 Cough, unspecified: Secondary | ICD-10-CM

## 2020-01-05 DIAGNOSIS — R05 Cough: Secondary | ICD-10-CM

## 2020-01-05 MED ORDER — HYDROCODONE-HOMATROPINE 5-1.5 MG/5ML PO SYRP: 5.0000 mL | ORAL_SOLUTION | Freq: Four times a day (QID) | ORAL | 0 refills | Status: DC | PRN

## 2020-01-05 NOTE — Telephone Encounter (Signed)
I have refilled them

## 2020-01-05 NOTE — Telephone Encounter (Signed)
Mychart message sent by pt requesting to have her hycodan cough med refilled. Dr. Vaughan Browner, please advise if you are okay sending Rx to pharmacy for pt.

## 2020-02-12 ENCOUNTER — Other Ambulatory Visit: Payer: Self-pay | Admitting: Allergy & Immunology

## 2020-02-12 DIAGNOSIS — R059 Cough, unspecified: Secondary | ICD-10-CM

## 2020-02-12 DIAGNOSIS — R05 Cough: Secondary | ICD-10-CM

## 2020-02-13 MED ORDER — HYDROCODONE-HOMATROPINE 5-1.5 MG/5ML PO SYRP: 5.0000 mL | ORAL_SOLUTION | Freq: Four times a day (QID) | ORAL | 0 refills | Status: DC | PRN

## 2020-02-13 NOTE — Telephone Encounter (Signed)
I have renewed it. °

## 2020-03-18 DIAGNOSIS — R059 Cough, unspecified: Secondary | ICD-10-CM

## 2020-03-18 DIAGNOSIS — R05 Cough: Secondary | ICD-10-CM

## 2020-03-18 NOTE — Telephone Encounter (Signed)
Dr. Vaughan Browner please advise on patients mychart messge:  Could you ask Dr. Vaughan Browner to refill my prescription cough medicine please.  He is treating me for stage 3 COPD with chronic cough.  Thank you.

## 2020-03-19 NOTE — Telephone Encounter (Signed)
Dr. Vaughan Browner - please advise on refill. Thanks.

## 2020-03-20 MED ORDER — HYDROCODONE-HOMATROPINE 5-1.5 MG/5ML PO SYRP: 5.0000 mL | ORAL_SOLUTION | Freq: Four times a day (QID) | ORAL | 0 refills | Status: DC | PRN

## 2020-03-20 NOTE — Telephone Encounter (Signed)
Apologize for the delay. The prescription has been sent

## 2020-03-26 ENCOUNTER — Other Ambulatory Visit: Payer: Self-pay

## 2020-03-26 ENCOUNTER — Telehealth: Payer: Self-pay | Admitting: Pulmonary Disease

## 2020-03-26 ENCOUNTER — Encounter: Payer: Self-pay | Admitting: Pulmonary Disease

## 2020-03-26 ENCOUNTER — Ambulatory Visit (INDEPENDENT_AMBULATORY_CARE_PROVIDER_SITE_OTHER): Payer: 59 | Admitting: Pulmonary Disease

## 2020-03-26 VITALS — BP 120/80 | HR 80 | Temp 97.4°F | Ht 69.0 in | Wt 173.6 lb

## 2020-03-26 DIAGNOSIS — J449 Chronic obstructive pulmonary disease, unspecified: Secondary | ICD-10-CM | POA: Diagnosis not present

## 2020-03-26 DIAGNOSIS — R0602 Shortness of breath: Secondary | ICD-10-CM

## 2020-03-26 DIAGNOSIS — Z1152 Encounter for screening for COVID-19: Secondary | ICD-10-CM

## 2020-03-26 NOTE — Progress Notes (Signed)
Theresa Franco    [id number]    July 24, 1961  Primary Care Physician:Zimmer, Theresa Saxon, MD  Referring Physician: Olena [place], [place] [address]  Chief complaint: Follow up for cough, COPD, asthma.  HPI: 59 year old with past medical history of hypertension, asthma, allergies.  She Franco history of chronic cough for several years.  She is evaluated by Dr. Farris Franco, Pulmonologist at [facility], [place].  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  She was given Xolair but had to stop after 1 dose as she developed severe hypertension.  She Franco been tried on breo, trelegy without any improvement in symptoms.  Her peripheral blood count does not show any eosinophilia and she was not a candidate for anti-IL5 therapy.  As per the patient she underwent a bronchoscopy in September 2018 to evaluate for endobronchial lesions which did not show any abnormalities. Medications significant for losartan.  She had been off this for > 1 year with no change in cough.  It had to be restarted after she developed hypertension after Xolair.  She Franco cough which is mostly nonproductive in nature.  This is more at night and sometimes keeps her up.  She Franco dyspnea with exertion.  Denies dyspnea at rest, no wheezing, fevers, chills.  Denies any heartburn symptoms.  Followed by Dr. Ernst Franco, Allergy.  Started on [place] therapy in late 2019 but stopped after a few doses as it did not make a difference and she was afraid of lowering her immunity and getting Covid CT in April 2019 shows centrilobular nodules for which she underwent bronchoscopy.  Results are nondiagnostic. She had a COPD exacerbation in early 2020 which was treated with antibiotics and steroids  Pets: 2 dogs but no birds, cats Occupation: Medical lab tech Exposures: No exposure, no mold at home Smoking history: 10-pack-year history.  Quit in 1993. Travel History: Not significant  Interim  history: Continues on Trelegy inhaler Complains of increasing cough for which she uses Hycodan Also Franco increasing dyspnea on exertion.  No sputum production, fevers, chills.  Outpatient Encounter Medications as of 03/26/2020  Medication Sig  . albuterol (PROAIR HFA) 108 (90 Base) MCG/ACT inhaler Inhale 2 puffs into the lungs every 6 (six) hours as needed for wheezing or shortness of breath.  . ALPRAZolam (XANAX) 0.5 MG tablet Take 0.5 mg by mouth 3 (three) times daily as needed for anxiety.   . Azelastine-Fluticasone (DYMISTA) 137-50 MCG/ACT SUSP Place 1 spray into the nose 2 (two) times daily.  Marland Kitchen buPROPion (WELLBUTRIN SR) 150 MG 12 hr tablet Take 150 mg by mouth 2 (two) times daily.  . cetirizine (ZYRTEC) 10 MG tablet Take 1 tablet (10 mg total) by mouth daily.  . Fluticasone-Umeclidin-Vilant (TRELEGY ELLIPTA) 100-62.5-25 MCG/INH AEPB Inhale 1 puff into the lungs daily.  Marland Kitchen gabapentin (NEURONTIN) 300 MG capsule Take 300 mg by mouth at bedtime.   Marland Kitchen HYDROcodone-homatropine (HYCODAN) 5-1.5 MG/5ML syrup Take 5 mLs by mouth every 6 (six) hours as needed for cough.  Marland Kitchen ibuprofen (ADVIL,MOTRIN) 200 MG tablet Take 400 mg by mouth every 6 (six) hours as needed for headache or moderate pain.  Marland Kitchen ipratropium-albuterol (DUONEB) 0.5-2.5 (3) MG/3ML SOLN Take 3 mLs by nebulization every 6 (six) hours as needed. (Patient taking differently: Take 3 mLs by nebulization every 6 (six) hours as needed (for shortness of breath or wheezing). )  . losartan (COZAAR) 50 MG tablet Take 50 mg by mouth daily.  Marland Kitchen  montelukast (SINGULAIR) 10 MG tablet Take 1 tablet by mouth once daily  . omeprazole (PRILOSEC) 40 MG capsule TAKE 1 CAPSULE BY MOUTH EVERY DAY  . Respiratory Therapy Supplies (FLUTTER) DEVI Use as directed.   No facility-administered encounter medications on file as of 03/26/2020.   Physical Exam: Blood pressure 120/82, pulse 78, temperature 98.3 F (36.8 C), temperature source Temporal, height _0  (1.753 m),  weight 169 lb 6.4 oz (76.8 kg), SpO2 97 %. Gen:      No acute distress HEENT:  EOMI, sclera anicteric Neck:     No masses; no thyromegaly Lungs:    Clear to auscultation bilaterally; normal respiratory effort CV:         Regular rate and rhythm; no murmurs Abd:      + bowel sounds; soft, non-tender; no palpable masses, no distension Ext:    No edema; adequate peripheral perfusion Skin:      Warm and dry; no rash Neuro: alert and oriented x 3 Psych: normal mood and affect  Data Reviewed: Imaging: High-resolution CT 03/10/2018-subtle centrilobular groundglass opacities, air trapping in the upper lobes.  Tiny pulmonary nodules the largest is 3 mm.  High-res CT 09/01/2019- resolution of groundglass opacities.  Pulmonary nodules are stable. I have reviewed the images personally.  PFTs: Spirometry from [place], [place] 03/10/17 FVC 2.71 [71%], pre-FEV1 1.53 [54%], post FEV1 1.70 [59%], % change 11 F/F 63 ABG 04/07/2017-7.4 2/40/73 Moderate obstruction with improvement in flow rates especially mid flows post bronchodilator suggestive of small airways disease.  03/03/18 FVC 1.70 (41%], FEV1 1.00 (31%], F/F 59, TLC 101, RV/TLC 173%, DLCO 67% Severe obstruction with air trapping, moderate diffusion defect   11/11/18 FVC 2.51 [61%], FEV1 1.51 [47%], F/F 60, TLC 100%, DLCO 82% Severe obstruction.  FENO 11/18/17-13 FENO 01/06/18-14 FENO 04/04/2018-7  Labs: CBC 11/18/17-WBC 4.7, eosinophils 1.1%, absolute eos count 100 RAST panel 11/18/17-IgE 752, sensitive to multiple allergens including cats, dogs, dust mite, pollen  Alpha-1 antitrypsin 03/03/2018-204, PI FM  Connective tissue serologies 04/04/2018-ANA, rheumatoid factor, CCP, hypersensitivity panel, double-stranded DNA, Ro, lab, SCL 70- negative  Bronchoscopy 04/15/2018 WBC 525, 11% lymphs, 89% monocyte macrophage Cultures- Negative Cytology-benign.  Path- benign lung and bronchial mucosa.  Sleep  Home sleep study 09/15/2019-AHI 3.5,  desats to 79%  Assessment:  Severe COPD, asthmatic bronchitis PFTs reviewed with severe obstruction, air trapping.   Continue Trelegy inhaler.  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  Repeat alpha-1 antitrypsin levels.   Allergic rhinitis, asthma Being followed at the allergy clinic by Dr. Ernst Franco.    GERD On omeprazole  Abnormal CT scan CT scan in 2019 reviewed with mild centrilobular nodules, air trapping.  Bronchoscopy and labs for connective tissue disease, hypersensitivity pneumonitis are negative There is no evidence of lymphocytosis or eosinophilia on BAL.  At this point I do not have a clear evidence of interstitial lung disease.  I suspect that the findings on CT scan may have been nonspecific inflammatory findings since the scan was done as she was recovering from an exacerbation.  Repeat CT shows resolution of these abnormalities.  However now as she is having increasing dyspnea we will reevaluate with high-res CT and PFTs  Lung nodules Follow-up on CT scan  Health maintenance Flu vaccine today 05/04/2018-Pneumovax  Plan/Recommendations: - Continue trelegy, duo nebs as  needed - High-res CT, PFTs - Recheck CBC, IgE, alpha-1 antitrypsin levels  [name] MD [facility] 03/26/2020, 2:41 PM  CC: Olena Mater, MD

## 2020-03-26 NOTE — Patient Instructions (Signed)
We will schedule high-res CT and pulmonary function test for evaluation Recheck CBC differential, IgE, alpha-1 antitrypsin levels.  Follow-up in 1 to 2 months

## 2020-03-26 NOTE — Telephone Encounter (Signed)
Spoke with pt, she states she can get her covid testing done at her job for her PFT. She just needs an order sent. Dr. Vaughan Browner is ok to send order for covid test? They use the swab test. Please advise.

## 2020-03-26 NOTE — Addendum Note (Signed)
Addended by: Suzzanne Cloud E on: 03/26/2020 03:05 PM   Modules accepted: Orders

## 2020-03-27 ENCOUNTER — Other Ambulatory Visit: Payer: Self-pay | Admitting: Pulmonary Disease

## 2020-03-27 ENCOUNTER — Other Ambulatory Visit: Payer: Self-pay | Admitting: Allergy & Immunology

## 2020-03-27 NOTE — Telephone Encounter (Signed)
OK to send order for COVID test

## 2020-03-27 NOTE — Telephone Encounter (Signed)
Spoke with patient. She is aware that Dr. Vaughan Browner has approved the order for [facility]. Order has been placed. Will fax to [place]. Nothing further needed at time of call.

## 2020-03-28 LAB — CBC WITH DIFFERENTIAL/PLATELET
Absolute Monocytes: 606 cells/uL (ref 200–950)
Basophils Absolute: 22 cells/uL (ref 0–200)
Basophils Relative: 0.5 %
Eosinophils Absolute: 39 cells/uL (ref 15–500)
Eosinophils Relative: 0.9 %
HCT: 44.5 % (ref 35.0–45.0)
Hemoglobin: 15.2 g/dL (ref 11.7–15.5)
Lymphs Abs: 1234 cells/uL (ref 850–3900)
MCH: 32.4 pg (ref 27.0–33.0)
MCHC: 34.2 g/dL (ref 32.0–36.0)
MCV: 94.9 fL (ref 80.0–100.0)
MPV: 11.3 fL (ref 7.5–12.5)
Monocytes Relative: 14.1 %
Neutro Abs: 2399 cells/uL (ref 1500–7800)
Neutrophils Relative %: 55.8 %
Platelets: 215 10*3/uL (ref 140–400)
RBC: 4.69 10*6/uL (ref 3.80–5.10)
RDW: 11.8 % (ref 11.0–15.0)
Total Lymphocyte: 28.7 %
WBC: 4.3 10*3/uL (ref 3.8–10.8)

## 2020-03-28 LAB — COMPREHENSIVE METABOLIC PANEL
AG Ratio: 2.1 (calc) (ref 1.0–2.5)
ALT: 25 U/L (ref 6–29)
AST: 21 U/L (ref 10–35)
Albumin: 4.8 g/dL (ref 3.6–5.1)
Alkaline phosphatase (APISO): 97 U/L (ref 37–153)
BUN: 7 mg/dL (ref 7–25)
CO2: 28 mmol/L (ref 20–32)
Calcium: 10.1 mg/dL (ref 8.6–10.4)
Chloride: 103 mmol/L (ref 98–110)
Creat: 0.98 mg/dL (ref 0.50–1.05)
Globulin: 2.3 g/dL (calc) (ref 1.9–3.7)
Glucose, Bld: 80 mg/dL (ref 65–99)
Potassium: 4 mmol/L (ref 3.5–5.3)
Sodium: 140 mmol/L (ref 135–146)
Total Bilirubin: 0.5 mg/dL (ref 0.2–1.2)
Total Protein: 7.1 g/dL (ref 6.1–8.1)

## 2020-03-28 LAB — ALPHA-1-ANTITRYPSIN: A-1 Antitrypsin, Ser: 123 mg/dL (ref 83–199)

## 2020-03-28 LAB — HOUSE ACCOUNT TRACKING

## 2020-03-28 LAB — IGE: IgE (Immunoglobulin E), Serum: 267 kU/L — ABNORMAL HIGH (ref <=114)

## 2020-03-29 ENCOUNTER — Telehealth: Payer: Self-pay | Admitting: Allergy & Immunology

## 2020-03-29 MED ORDER — MONTELUKAST SODIUM 10 MG PO TABS: 10.0000 mg | ORAL_TABLET | Freq: Every day | ORAL | 0 refills | Status: DC

## 2020-03-29 NOTE — Telephone Encounter (Signed)
Patient called to reschedule an appointment for 04/16/2020 at 1:30pm. Patient states she needs a refill on Singulair, but the previous refill was denied.   Please advise.

## 2020-03-29 NOTE — Telephone Encounter (Signed)
Called and spoke with the patient. Courtesy refill has been sent to [facility] in [place]. Advised to patient to please keep her appointment and we will get her taken care of when she comes in. Patient verbalized understanding.

## 2020-04-11 ENCOUNTER — Ambulatory Visit (HOSPITAL_COMMUNITY)
Admission: RE | Admit: 2020-04-11 | Discharge: 2020-04-11 | Disposition: A | Discharge status: Home / Self Care | Payer: 59 | Source: Ambulatory Visit | Attending: Pulmonary Disease | Admitting: Pulmonary Disease

## 2020-04-11 ENCOUNTER — Other Ambulatory Visit: Payer: Self-pay

## 2020-04-11 DIAGNOSIS — R0602 Shortness of breath: Secondary | ICD-10-CM

## 2020-04-11 DIAGNOSIS — J449 Chronic obstructive pulmonary disease, unspecified: Secondary | ICD-10-CM | POA: Diagnosis present

## 2020-04-11 DIAGNOSIS — J4489 Other specified chronic obstructive pulmonary disease: Secondary | ICD-10-CM

## 2020-04-25 DIAGNOSIS — R059 Cough, unspecified: Secondary | ICD-10-CM

## 2020-04-25 NOTE — Telephone Encounter (Signed)
Dr. Mannam - please advise. Thanks. 

## 2020-04-26 ENCOUNTER — Encounter: Payer: Self-pay | Admitting: Allergy & Immunology

## 2020-04-26 ENCOUNTER — Other Ambulatory Visit: Payer: Self-pay

## 2020-04-26 ENCOUNTER — Ambulatory Visit (INDEPENDENT_AMBULATORY_CARE_PROVIDER_SITE_OTHER): Payer: 59 | Admitting: Allergy & Immunology

## 2020-04-26 VITALS — BP 128/82 | HR 74 | Temp 97.4°F

## 2020-04-26 DIAGNOSIS — J302 Other seasonal allergic rhinitis: Secondary | ICD-10-CM

## 2020-04-26 DIAGNOSIS — J679 Hypersensitivity pneumonitis due to unspecified organic dust: Secondary | ICD-10-CM

## 2020-04-26 DIAGNOSIS — J449 Chronic obstructive pulmonary disease, unspecified: Secondary | ICD-10-CM | POA: Diagnosis not present

## 2020-04-26 DIAGNOSIS — J3089 Other allergic rhinitis: Secondary | ICD-10-CM | POA: Diagnosis not present

## 2020-04-26 MED ORDER — IPRATROPIUM BROMIDE 0.03 % NA SOLN: NASAL | 3 refills | Status: DC

## 2020-04-26 MED ORDER — HYDROCODONE-HOMATROPINE 5-1.5 MG/5ML PO SYRP: 5.0000 mL | ORAL_SOLUTION | Freq: Four times a day (QID) | ORAL | 0 refills | Status: DC | PRN

## 2020-04-26 NOTE — Patient Instructions (Addendum)
Allergic rhinitis Continue cetirizine 10 mg once a day to help with runny nose and itching. Continue Dymista nasal spray 2 sprays in each nostril 1-2 times a day as needed Start ipratropium bromide nasal spray 0.03% may use 2 sprays in each nostril 2-3 times a day as needed for runny nose.  Severe persistent asthma/COPD overlap Continue Trelegy 1 puff once a day. Continue Singulair 10 mg 1 tablet once a day. May use albuterol 2 puffs every 4 hours as needed for coughing, wheezing, tightness in chest or shortness of breath. Also may use albuterol 2 puffs 5 to 15 minutes prior to exercise. Continue on oxygen at 2 lpm at night Keep already scheduled follow-up appointment on May 25,2021 with pulmonologist.  Continue all other scheduled medications. Please let us know if this treatment plan is not working well for you Schedule follow-up appointment in 6 months.

## 2020-04-26 NOTE — Progress Notes (Signed)
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 Continue Dymista nasal spray 2 sprays in each nostril 1-2 times a day as needed Start ipratropium bromide nasal spray 0.03% may use 2 sprays in each nostril 2-3 times a day as needed for runny nose.  Severe persistent asthma/COPD overlap Continue Trelegy 1 puff once a day. Continue Singulair 10 mg 1 tablet once a day. May use albuterol 2 puffs every 4 hours as needed for coughing, wheezing, tightness in chest or shortness of breath. Also may use albuterol 2 puffs 5 to 15 minutes prior to exercise. Continue on oxygen at 2 lpm at night Keep already scheduled follow-up appointment on May 25,2021 with pulmonologist.  Continue all other scheduled medications.  Please let us know if this treatment plan is not working well for you Schedule follow-up appointment in 6 months.    Subjective:   Theresa Franco is a 59 y.o. female presenting today for follow up of  Chief Complaint  Patient presents with  . COPD    Has been experiencing some SOB and fatigue.     Theresa Franco has a history of the following: Patient Active Problem List   Diagnosis Date Noted  . ILD (interstitial lung disease) ([place])   . COPD with asthma ([place]) 03/26/2018  . Seasonal and perennial allergic rhinitis 03/26/2018  . Gastroesophageal reflux disease 03/26/2018    History obtained from: chart review and patient.  Theresa Franco is a 60 y.o. female presenting  for a follow up visit.  She was last seen in June 2020 via televisit.  At that time, her allergic rhinitis was under good control with Zyrtec, Dymista, and nasal saline rinses.  Reflux was controlled with omeprazole 40 mg daily.  We continue with Singulair 10 mg daily and Trelegy 1 puff once daily.  She had a chest CT to follow up on her changes in May 2021. This demonstrated the following: 1. No evidence of interstitial lung disease. 2. Stable diffuse bronchial wall thickening, as can be seen with chronic bronchitis or reactive airways disease. 3. Small hiatal hernia. 4. Aortic Atherosclerosis (ICD10-I70.0).  Her last pulmonology appointment was in April 2021.  At that time, her PFT showed severe restriction and air trapping.  She was continued on Trelegy 1 puff once daily.  She is noted to be a heterozygous antitrypsin carrier.  She is on omeprazole for GERD.  Asthma/Respiratory Symptom History: She reports fatiuge, shortness with rest and exertion, and chronic productive cough with clear sputum that is worse in the morning.She denies any wheezing or tightness in her chest. Her cough is worse with weather changes.She continues to use Trelegy one puff once a day and Singulair 10 mg once. She has not used her albuterol hfa  or via nebulizer since her last appoinment. When she has used her nebulizer in the past it has caused her to get "hyped up" and not be able to relax at night.She is unsure of when she should use albuterol and thought it should be used for really bad symptoms. She has an appointment on 04/30/2020  for a pulmonary function test and to see Dr. Rolla Etienne. Of note, she does have a history of being a heterozygous antitrypsin carrier; this results in normal levels of alpha-1 antitrypsin but likely defective function, per Dr. Alonna Buckler note.   Allergic Rhinitis Symptom History: She reports that her symptoms have been better since the addition of dymista,but she continues to have clear rhinorrhea  and sneezing. She is frequently having to blow her nose. She denies any nasal congestion or post nasal drip. She continues to also take cetirizine 10 mg once a day.   Otherwise, there have been no changes to her past medical history, surgical history, family history, or social history.    Review of Systems  Constitutional: Negative for chills and fever.  HENT: Negative for congestion.        Reports clear rhinorrhea  Eyes:       Denies occular pruritus  Respiratory: Positive for cough and shortness of breath. Negative for wheezing.   Cardiovascular: Negative for chest pain and palpitations.  Skin: Negative for itching and rash.  Neurological: Negative for headaches.  Endo/Heme/Allergies: Positive for environmental allergies.       Objective:   Blood pressure 128/82, pulse 74, temperature (!) 97.4 F (36.3 C), temperature source Temporal, SpO2 99 %. There is no height or weight on file to calculate BMI.   Physical Exam:  Physical Exam  Constitutional: She is oriented to person, place, and time. She appears well-developed and well-nourished.  HENT:  Head: Normocephalic and atraumatic.  Right Ear: External ear normal.  Left Ear: External ear normal.  Mouth/Throat: Oropharynx is clear and moist.  Nose: slightly edematous with clear drainage.  Eyes: Conjunctivae are normal.  Cardiovascular: Normal rate, regular rhythm and normal heart sounds.  Respiratory: Effort normal and breath sounds normal.  Lungs clear to auscultation  Musculoskeletal:     Cervical back: Neck supple.  Neurological: She is alert and oriented to person, place, and time.  Skin: Skin is warm.  Psychiatric: She has a normal mood and affect. Her behavior is normal. Judgment and thought content normal.     Diagnostic studies:   Spirometry: FVC 2.35, FEV1: 1.45. Predicted FVC 4.01, FEV1: 3.12. Spirometry indicates moderate constriction and mild airway obstruction. Today's spirometry shows improvement from  previous spirometry.    Thank you for the opportunity to care for this patient.  Please let me know if you have any questions.  Theresa Charon, FNP [facility]   I performed a history and physical examination of the patient and discussed her management with the Nurse Practitioner. I reviewed the Nurse Practitioner's note and agree with the documented findings and plan of care. The note in its entirety was edited by myself, including the physical exam, assessment, and plan.    Theresa Marvel, MD  [facility]

## 2020-04-26 NOTE — Telephone Encounter (Signed)
Done

## 2020-04-28 ENCOUNTER — Encounter: Payer: Self-pay | Admitting: Allergy & Immunology

## 2020-04-30 ENCOUNTER — Other Ambulatory Visit: Payer: Self-pay

## 2020-04-30 ENCOUNTER — Ambulatory Visit (INDEPENDENT_AMBULATORY_CARE_PROVIDER_SITE_OTHER): Payer: 59 | Admitting: Pulmonary Disease

## 2020-04-30 ENCOUNTER — Encounter: Payer: Self-pay | Admitting: Pulmonary Disease

## 2020-04-30 VITALS — BP 124/74 | HR 74 | Temp 97.1°F | Ht 70.0 in | Wt 179.2 lb

## 2020-04-30 DIAGNOSIS — J849 Interstitial pulmonary disease, unspecified: Secondary | ICD-10-CM

## 2020-04-30 DIAGNOSIS — J449 Chronic obstructive pulmonary disease, unspecified: Secondary | ICD-10-CM

## 2020-04-30 DIAGNOSIS — R0602 Shortness of breath: Secondary | ICD-10-CM

## 2020-04-30 DIAGNOSIS — J4489 Other specified chronic obstructive pulmonary disease: Secondary | ICD-10-CM

## 2020-04-30 LAB — PULMONARY FUNCTION TEST
DL/VA % pred: 142 %
DL/VA: 5.81 ml/min/mmHg/L
DLCO cor % pred: 96 %
DLCO cor: 23.23 ml/min/mmHg
DLCO unc % pred: 101 %
DLCO unc: 24.42 ml/min/mmHg
FEF 25-75 Post: 0.61 L/sec
FEF 25-75 Pre: 0.73 L/sec
FEF2575-%Change-Post: -16 %
FEF2575-%Pred-Post: 21 %
FEF2575-%Pred-Pre: 26 %
FEV1-%Change-Post: -3 %
FEV1-%Pred-Post: 44 %
FEV1-%Pred-Pre: 45 %
FEV1-Post: 1.38 L
FEV1-Pre: 1.43 L
FEV1FVC-%Change-Post: 1 %
FEV1FVC-%Pred-Pre: 76 %
FEV6-%Change-Post: -2 %
FEV6-%Pred-Post: 58 %
FEV6-%Pred-Pre: 59 %
FEV6-Post: 2.26 L
FEV6-Pre: 2.32 L
FEV6FVC-%Change-Post: 0 %
FEV6FVC-%Pred-Post: 102 %
FEV6FVC-%Pred-Pre: 102 %
FVC-%Change-Post: -4 %
FVC-%Pred-Post: 56 %
FVC-%Pred-Pre: 59 %
FVC-Post: 2.28 L
FVC-Pre: 2.38 L
Post FEV1/FVC ratio: 61 %
Post FEV6/FVC ratio: 99 %
Pre FEV1/FVC ratio: 60 %
Pre FEV6/FVC Ratio: 99 %
RV % pred: 108 %
RV: 2.36 L
TLC % pred: 85 %
TLC: 4.96 L

## 2020-04-30 NOTE — Progress Notes (Signed)
PFT done today. 

## 2020-04-30 NOTE — Progress Notes (Signed)
Theresa Franco    [id number]    [birth year]  Primary Care Physician:Zimmer, [name], MD  Referring Physician: Olena Mater, [place] [address]  Chief complaint: Follow up for cough, COPD, asthma.  HPI: 59 year old with past medical history of hypertension, asthma, allergies.  She has history of chronic cough for several years.  She is evaluated by Dr. [name], Pulmonologist at [place], [place].  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  She was given Xolair but had to stop after 1 dose as she developed severe hypertension.  She has been tried on breo, trelegy without any improvement in symptoms.  Her peripheral blood count does not show any eosinophilia and she was not a candidate for anti-IL5 therapy.  As per the patient she underwent a bronchoscopy in September 2018 to evaluate for endobronchial lesions which did not show any abnormalities. Medications significant for losartan.  She had been off this for > 1 year with no change in cough.  It had to be restarted after she developed hypertension after Xolair.  She has cough which is mostly nonproductive in nature.  This is more at night and sometimes keeps her up.  She has dyspnea with exertion.  Denies dyspnea at rest, no wheezing, fevers, chills.  Denies any heartburn symptoms.  Followed by Dr. [name], Allergy.  Started on [place] therapy in late 2019 but stopped after a few doses as it did not make a difference and she was afraid of lowering her immunity and getting Covid CT in April 2019 shows centrilobular nodules for which she underwent bronchoscopy.  Results are nondiagnostic. She had a COPD exacerbation in early 2020 which was treated with antibiotics and steroids  Pets: 2 dogs but no birds, cats Occupation: Medical lab tech Exposures: No exposure, no mold at home Smoking history: 10-pack-year history.  Quit in 1993. Travel History: Not significant  Interim  history: Continues on Trelegy inhaler Complains of cough for which she uses Hycodan  Outpatient Encounter Medications as of 04/30/2020  Medication Sig  . albuterol (PROAIR HFA) 108 (90 Base) MCG/ACT inhaler Inhale 2 puffs into the lungs every 6 (six) hours as needed for wheezing or shortness of breath.  . ALPRAZolam (XANAX) 0.5 MG tablet Take 0.5 mg by mouth 3 (three) times daily as needed for anxiety.   Marland Kitchen buPROPion (WELLBUTRIN SR) 150 MG 12 hr tablet Take 150 mg by mouth 2 (two) times daily.  . cetirizine (ZYRTEC) 10 MG tablet Take 1 tablet (10 mg total) by mouth daily.  . Fluticasone-Umeclidin-Vilant (TRELEGY ELLIPTA) 100-62.5-25 MCG/INH AEPB Inhale 1 puff into the lungs daily.  Marland Kitchen gabapentin (NEURONTIN) 300 MG capsule Take 300 mg by mouth at bedtime.   Marland Kitchen HYDROcodone-homatropine (HYCODAN) 5-1.5 MG/5ML syrup Take 5 mLs by mouth every 6 (six) hours as needed for cough.  Marland Kitchen ibuprofen (ADVIL,MOTRIN) 200 MG tablet Take 400 mg by mouth every 6 (six) hours as needed for headache or moderate pain.  Marland Kitchen ipratropium (ATROVENT) 0.03 % nasal spray Use 2 sprays in each nostril 2-3 times daily as needed for runny nose.  Marland Kitchen ipratropium-albuterol (DUONEB) 0.5-2.5 (3) MG/3ML SOLN Take 3 mLs by nebulization every 6 (six) hours as needed. (Patient taking differently: Take 3 mLs by nebulization every 6 (six) hours as needed (for shortness of breath or wheezing). )  . losartan (COZAAR) 50 MG tablet Take 50 mg by mouth daily.  . montelukast (SINGULAIR) 10 MG tablet Take 1  tablet (10 mg total) by mouth daily.  Marland Kitchen omeprazole (PRILOSEC) 40 MG capsule TAKE 1 CAPSULE BY MOUTH EVERY DAY  . Respiratory Therapy Supplies (FLUTTER) DEVI Use as directed.  . Azelastine-Fluticasone (DYMISTA) 137-50 MCG/ACT SUSP Place 1 spray into the nose 2 (two) times daily.   No facility-administered encounter medications on file as of 04/30/2020.   Physical Exam: Blood pressure 124/74, pulse 74, temperature (!) 97.1 F (36.2 C), temperature  source Oral, height _0  (1.778 m), weight 179 lb 3.2 oz (81.3 kg), SpO2 100 %. Gen:      No acute distress HEENT:  EOMI, sclera anicteric Neck:     No masses; no thyromegaly Lungs:    Clear to auscultation bilaterally; normal respiratory effort CV:         Regular rate and rhythm; no murmurs Abd:      + bowel sounds; soft, non-tender; no palpable masses, no distension Ext:    No edema; adequate peripheral perfusion Skin:      Warm and dry; no rash Neuro: alert and oriented x 3 Psych: normal mood and affect  Data Reviewed: Imaging: High-resolution CT 03/10/2018-subtle centrilobular groundglass opacities, air trapping in the upper lobes.  Tiny pulmonary nodules the largest is 3 mm.  High-res CT 09/01/2019- resolution of groundglass opacities.  Pulmonary nodules are stable.  High-res CT 04/11/2020-no interstitial lung disease, bronchial wall thickening, small hiatal hernia. I reviewed the images personally.  PFTs: Spirometry from [place], [place] 03/10/17 FVC 2.71 [71%], pre-FEV1 1.53 [54%], post FEV1 1.70 [59%], % change 11 F/F 63 ABG 04/07/2017-7.4 2/40/73 Moderate obstruction with improvement in flow rates especially mid flows post bronchodilator suggestive of small airways disease.  03/03/18 FVC 1.70 (41%], FEV1 1.00 (31%], F/F 59, TLC 101, RV/TLC 173%, DLCO 67% Severe obstruction with air trapping, moderate diffusion defect   11/11/18 FVC 2.51 [61%], FEV1 1.51 [47%], F/F 60, TLC 100%, DLCO 82% Severe obstruction.  04/30/2020 FVC 2.28 (56%), FEV1 1.38 (44%), F/F 61, TLC 4.96 [85%], DLCO 24.42 [101%] Severe obstruction with air trapping  FENO 11/18/17-13 FENO 01/06/18-14 FENO 04/04/2018-7  Labs: CBC 11/18/17-WBC 4.7, eosinophils 1.1%, absolute eos count 100 RAST panel 11/18/17-IgE 752, sensitive to multiple allergens including cats, dogs, dust mite, pollen  Alpha-1 antitrypsin 03/03/2018-204, PI FM Alpha-1 antitrypsin 03/27/2020-123  Connective tissue serologies 04/04/2018-ANA,  rheumatoid factor, CCP, hypersensitivity panel, double-stranded DNA, Ro, lab, SCL 70- negative  Bronchoscopy 04/15/2018 WBC 525, 11% lymphs, 89% monocyte macrophage Cultures- Negative Cytology-benign.  Path- benign lung and bronchial mucosa.  Sleep  Home sleep study 09/15/2019-AHI 3.5, desats to 79%  Assessment:  Severe COPD, asthmatic bronchitis PFTs reviewed with severe obstruction, air trapping.   Continue Trelegy inhaler.  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 [name].    GERD On omeprazole  Abnormal CT scan CT scan in 2019 reviewed with mild centrilobular nodules, air trapping.  Bronchoscopy and labs for connective tissue disease, hypersensitivity pneumonitis are negative There is no evidence of lymphocytosis or eosinophilia on BAL.  At this point I do not have a clear evidence of interstitial lung disease.  I suspect that the findings on CT scan may have been nonspecific inflammatory findings since the scan was done as she was recovering from an exacerbation.  Repeat CT shows resolution of these abnormalities.   Lung nodules Follow-up on CT scan  Health maintenance 05/04/2018-Pneumovax  Plan/Recommendations: - Continue trelegy, duo nebs as needed  [name] MD [facility] 04/30/2020, 1:46 PM  CC: Olena Mater, MD

## 2020-04-30 NOTE — Telephone Encounter (Signed)
Called and spoke with patient due to the nature of the MyChart message. Patient was very tearful on the phone. She stated that she had a PFT and OV today with Dr. Vaughan Browner and she did not leave with answers in regards to her continued SOB and fatigue. She also stated that he had mentioned that her lung function had decreased but did not tell her exactly what percentage.   She also wanted to know if pulmonary rehab would be an option for her. She has concerns about attending pulmonary rehab because she works for [employer] and it is not easy getting off from work. Her relief person has to travel from [place] and she lives in [place]. If she needs to attend pulmonary rehab, she is willing to try it.   She apologized several times about crying. I advised her that it was ok to cry and we are here for her anytime she needs to talk.   Dr. Vaughan Browner, can you please advise. Thanks!

## 2020-04-30 NOTE — Patient Instructions (Signed)
Continue the Trelegy inhaler Follow-up in 6 months.

## 2020-05-02 NOTE — Telephone Encounter (Signed)
I called and discussed with patient Her PFTs are very minimally changed Recent CT chest with no interstitial lung disease Please order BNP, Met panel and echocardiogram  Regarding pulmonary rehab we had discussed that at the last clinic visit.  At present she cannot take time off work.  Will reassess at return clinic visit.

## 2020-05-03 NOTE — Telephone Encounter (Signed)
BNP, BMET and ECHO ordered. Nothing further needed at this time.

## 2020-05-08 ENCOUNTER — Other Ambulatory Visit: Payer: Self-pay | Admitting: Allergy & Immunology

## 2020-05-17 ENCOUNTER — Other Ambulatory Visit (HOSPITAL_COMMUNITY): Payer: 59

## 2020-05-20 ENCOUNTER — Ambulatory Visit (HOSPITAL_COMMUNITY)
Admission: RE | Admit: 2020-05-20 | Discharge: 2020-05-20 | Disposition: A | Discharge status: Home / Self Care | Payer: 59 | Source: Ambulatory Visit | Attending: Pulmonary Disease | Admitting: Pulmonary Disease

## 2020-05-20 ENCOUNTER — Other Ambulatory Visit: Payer: Self-pay

## 2020-05-20 DIAGNOSIS — J449 Chronic obstructive pulmonary disease, unspecified: Secondary | ICD-10-CM | POA: Diagnosis present

## 2020-05-20 DIAGNOSIS — J849 Interstitial pulmonary disease, unspecified: Secondary | ICD-10-CM | POA: Insufficient documentation

## 2020-05-20 NOTE — Progress Notes (Signed)
*  PRELIMINARY RESULTS* Echocardiogram 2D Echocardiogram has been performed.  Theresa Franco 05/20/2020, 3:24 PM

## 2020-05-30 DIAGNOSIS — R059 Cough, unspecified: Secondary | ICD-10-CM

## 2020-05-30 MED ORDER — HYDROCODONE-HOMATROPINE 5-1.5 MG/5ML PO SYRP: 5.0000 mL | ORAL_SOLUTION | Freq: Four times a day (QID) | ORAL | 0 refills | Status: DC | PRN

## 2020-05-30 NOTE — Telephone Encounter (Signed)
I have refilled  Also please let her know that recent echo shows normal heart function. There is mild stiffening of the heart wall that is not very concerning.

## 2020-05-30 NOTE — Telephone Encounter (Signed)
Received email from patient :  Would you please ask Dr. Vaughan Browner to refill my prescription cough medicine hycodan. He is treating me for a chronic cough and stage 3 COPD. My pharmacy is [facility] in [place], [place]. Thank you.  Dr. Vaughan Browner please advise on refill

## 2020-07-03 DIAGNOSIS — R059 Cough, unspecified: Secondary | ICD-10-CM

## 2020-07-03 MED ORDER — HYDROCODONE-HOMATROPINE 5-1.5 MG/5ML PO SYRP: 5.0000 mL | ORAL_SOLUTION | Freq: Four times a day (QID) | ORAL | 0 refills | Status: DC | PRN

## 2020-07-03 NOTE — Telephone Encounter (Signed)
Refill request Hycodan, last refill 05/30/2020, last OV 04/30/2020, COPD and chronic cough. Please refill/advise.

## 2020-07-28 DIAGNOSIS — R7989 Other specified abnormal findings of blood chemistry: Secondary | ICD-10-CM | POA: Insufficient documentation

## 2020-07-29 DIAGNOSIS — K838 Other specified diseases of biliary tract: Secondary | ICD-10-CM | POA: Insufficient documentation

## 2020-08-07 DIAGNOSIS — R059 Cough, unspecified: Secondary | ICD-10-CM

## 2020-08-07 NOTE — Telephone Encounter (Signed)
Refill request Hycodan 5-1.5mg , 5 ml every 6 hours for cough, last refill 07/03/2020. Please refill/advise.

## 2020-08-08 MED ORDER — HYDROCODONE-HOMATROPINE 5-1.5 MG/5ML PO SYRP: 5.0000 mL | ORAL_SOLUTION | Freq: Four times a day (QID) | ORAL | 0 refills | Status: DC | PRN

## 2020-08-08 NOTE — Telephone Encounter (Signed)
Ok. Done 

## 2020-09-09 DIAGNOSIS — R059 Cough, unspecified: Secondary | ICD-10-CM

## 2020-09-09 NOTE — Telephone Encounter (Signed)
Patient is requesting refill on Hycodan 5mg . Last refilled 08/08/2020 #240 with 0 refills. Last OV 04/30/2020 with pending recall for 10/2020.  Dr. Vaughan Browner, please advise. Thanks

## 2020-09-10 MED ORDER — HYDROCODONE-HOMATROPINE 5-1.5 MG/5ML PO SYRP: 5.0000 mL | ORAL_SOLUTION | Freq: Four times a day (QID) | ORAL | 0 refills | Status: DC | PRN

## 2020-09-10 NOTE — Telephone Encounter (Signed)
Ok renewed.

## 2020-10-09 ENCOUNTER — Other Ambulatory Visit: Payer: Self-pay

## 2020-10-09 DIAGNOSIS — R059 Cough, unspecified: Secondary | ICD-10-CM

## 2020-10-10 NOTE — Telephone Encounter (Signed)
Dr. Vaughan Browner, Please see patient request for refill of cough medication.  Thank you.

## 2020-10-11 MED ORDER — HYDROCODONE-HOMATROPINE 5-1.5 MG/5ML PO SYRP: 5.0000 mL | ORAL_SOLUTION | Freq: Four times a day (QID) | ORAL | 0 refills | Status: DC | PRN

## 2020-10-11 NOTE — Telephone Encounter (Signed)
I have ordered.

## 2020-10-15 NOTE — Telephone Encounter (Signed)
Called pt's [facility] to make sure that pt was not able to pick up Rx due to them being out of the med. Spoke with Earlie Server at [facility] to see if pt was able to get her Judithe Modest and Earlie Server stated that it was on temporary backorder there until December.  Pt sent mychart message requesting to have [facility] [address] in [place], [place]. Dr. Vaughan Browner, can you please send pt's Rx to that pharmacy for pt.

## 2020-10-16 ENCOUNTER — Telehealth: Payer: Self-pay | Admitting: Pulmonary Disease

## 2020-10-16 DIAGNOSIS — R059 Cough, unspecified: Secondary | ICD-10-CM

## 2020-10-16 NOTE — Telephone Encounter (Signed)
Spoke with the pt  She states that her rx for hydocan that we sent to [facility] on 10/11/20 has been cancelled bc med out of stock  She is now wanting Dr Vaughan Browner to send rx to the [facility] in [place]  Please advise, thanks!

## 2020-10-16 NOTE — Telephone Encounter (Signed)
Lm for patient.  

## 2020-10-16 NOTE — Telephone Encounter (Signed)
Dr. Vaughan Browner please advise on the following MyChart message;   Could you call in my prescription cough medicine to [facility] Dr. Angelina Sheriff.  Their fax machine is not working.  Thank you.  Thank you

## 2020-10-17 MED ORDER — HYDROCODONE-HOMATROPINE 5-1.5 MG/5ML PO SYRP: 5.0000 mL | ORAL_SOLUTION | Freq: Four times a day (QID) | ORAL | 0 refills | Status: DC | PRN

## 2020-10-17 NOTE — Addendum Note (Signed)
Addended byMarshell Garfinkel on: 10/17/2020 07:23 PM   Modules accepted: Orders

## 2020-10-17 NOTE — Telephone Encounter (Signed)
Another mychart message sent by pt checking to see if Rx had been called in to [facility] [address] In [place], [place]. Per Amanda's message, a verbal of the Rx will need to be called in as their fax machine is not working.  Phone number: [phone] for [facility] [place], [place]

## 2020-10-17 NOTE — Telephone Encounter (Signed)
Ok. Done 

## 2020-10-18 NOTE — Telephone Encounter (Signed)
Lm for patient.  

## 2020-11-04 ENCOUNTER — Other Ambulatory Visit: Payer: Self-pay

## 2020-11-04 ENCOUNTER — Ambulatory Visit (INDEPENDENT_AMBULATORY_CARE_PROVIDER_SITE_OTHER): Payer: 59 | Admitting: *Deleted

## 2020-11-04 DIAGNOSIS — Z23 Encounter for immunization: Secondary | ICD-10-CM | POA: Diagnosis not present

## 2020-11-07 ENCOUNTER — Other Ambulatory Visit: Payer: Self-pay | Admitting: Pulmonary Disease

## 2020-11-08 ENCOUNTER — Ambulatory Visit (INDEPENDENT_AMBULATORY_CARE_PROVIDER_SITE_OTHER): Payer: 59 | Admitting: Pulmonary Disease

## 2020-11-08 ENCOUNTER — Other Ambulatory Visit: Payer: Self-pay

## 2020-11-08 ENCOUNTER — Encounter: Payer: Self-pay | Admitting: Pulmonary Disease

## 2020-11-08 VITALS — BP 122/74 | HR 97 | Temp 97.4°F | Ht 70.0 in | Wt 167.6 lb

## 2020-11-08 DIAGNOSIS — R059 Cough, unspecified: Secondary | ICD-10-CM | POA: Diagnosis not present

## 2020-11-08 DIAGNOSIS — J449 Chronic obstructive pulmonary disease, unspecified: Secondary | ICD-10-CM

## 2020-11-08 MED ORDER — HYDROCODONE-HOMATROPINE 5-1.5 MG/5ML PO SYRP: 5.0000 mL | ORAL_SOLUTION | Freq: Four times a day (QID) | ORAL | 0 refills | Status: DC | PRN

## 2020-11-08 NOTE — Progress Notes (Signed)
Theresa Franco    [id number]    1961/10/12  Primary Care Physician:Zimmer, [name], MD  Referring Physician: Olena Mater, [place] [address]  Chief complaint: Follow up for cough, COPD, asthma.  HPI: 59 year old with past medical history of hypertension, asthma, allergies.  She has history of chronic cough for several years.  She is evaluated by Dr. [name], Pulmonologist at [place], [place].  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  She was given Xolair but had to stop after 1 dose as she developed severe hypertension.  She has been tried on breo, trelegy without any improvement in symptoms.  Her peripheral blood count does not show any eosinophilia and she was not a candidate for anti-IL5 therapy.  As per the patient she underwent a bronchoscopy in September 2018 to evaluate for endobronchial lesions which did not show any abnormalities. Medications significant for losartan.  She had been off this for > 1 year with no change in cough.  It had to be restarted after she developed hypertension after Xolair.  She has cough which is mostly nonproductive in nature.  This is more at night and sometimes keeps her up.  She has dyspnea with exertion.  Denies dyspnea at rest, no wheezing, fevers, chills.  Denies any heartburn symptoms.  Followed by Dr. [name], Allergy.  Started on Perry therapy in late 2019 but stopped after a few doses as it did not make a difference and she was afraid of lowering her immunity and getting Covid CT in April 2019 shows centrilobular nodules for which she underwent bronchoscopy.  Results are nondiagnostic. She had a COPD exacerbation in early 2020 which was treated with antibiotics and steroids  Pets: 2 dogs but no birds, cats Occupation: Medical lab tech Exposures: No exposure, no mold at home Smoking history: 10-pack-year history.  Quit in 1993. Travel History: Not significant  Interim  history: Continues on Trelegy inhaler  Reports some difficulty with vision more recently with bright lights. She is wondering if this is from trelegy and is due to see her ophthalmologist soon.  Outpatient Encounter Medications as of 11/08/2020  Medication Sig  . albuterol (PROAIR HFA) 108 (90 Base) MCG/ACT inhaler Inhale 2 puffs into the lungs every 6 (six) hours as needed for wheezing or shortness of breath.  . ALPRAZolam (XANAX) 0.5 MG tablet Take 0.5 mg by mouth 3 (three) times daily as needed for anxiety.   Marland Kitchen buPROPion (WELLBUTRIN SR) 150 MG 12 hr tablet Take 150 mg by mouth 2 (two) times daily.  . cetirizine (ZYRTEC) 10 MG tablet Take 1 tablet (10 mg total) by mouth daily.  Marland Kitchen gabapentin (NEURONTIN) 300 MG capsule Take 300 mg by mouth at bedtime.   Marland Kitchen HYDROcodone-homatropine (HYCODAN) 5-1.5 MG/5ML syrup Take 5 mLs by mouth every 6 (six) hours as needed for cough.  Marland Kitchen ibuprofen (ADVIL,MOTRIN) 200 MG tablet Take 400 mg by mouth every 6 (six) hours as needed for headache or moderate pain.  Marland Kitchen ipratropium (ATROVENT) 0.03 % nasal spray Use 2 sprays in each nostril 2-3 times daily as needed for runny nose.  Marland Kitchen ipratropium-albuterol (DUONEB) 0.5-2.5 (3) MG/3ML SOLN Take 3 mLs by nebulization every 6 (six) hours as needed. (Patient taking differently: Take 3 mLs by nebulization every 6 (six) hours as needed (for shortness of breath or wheezing). )  . losartan (COZAAR) 50 MG tablet Take 50 mg by mouth daily.  Marland Kitchen losartan-hydrochlorothiazide (HYZAAR) 50-12.5  MG tablet Take 1 tablet by mouth daily.  . montelukast (SINGULAIR) 10 MG tablet Take 1 tablet by mouth once daily  . omeprazole (PRILOSEC) 40 MG capsule TAKE 1 CAPSULE BY MOUTH EVERY DAY  . Respiratory Therapy Supplies (FLUTTER) DEVI Use as directed.  . TRELEGY ELLIPTA 100-62.5-25 MCG/INH AEPB INHALE 1 PUFF ONCE DAILY  . Azelastine-Fluticasone (DYMISTA) 137-50 MCG/ACT SUSP Place 1 spray into the nose 2 (two) times daily.   No facility-administered  encounter medications on file as of 11/08/2020.   Physical Exam: Blood pressure 122/74, pulse 97, temperature (!) 97.4 F (36.3 C), temperature source Oral, height _0  (1.778 m), weight 167 lb 9.6 oz (76 kg), SpO2 98 %. Gen:      No acute distress HEENT:  EOMI, sclera anicteric Neck:     No masses; no thyromegaly Lungs:    Clear to auscultation bilaterally; normal respiratory effort CV:         Regular rate and rhythm; no murmurs Abd:      + bowel sounds; soft, non-tender; no palpable masses, no distension Ext:    No edema; adequate peripheral perfusion Skin:      Warm and dry; no rash Neuro: alert and oriented x 3 Psych: normal mood and affect  Data Reviewed: Imaging: High-resolution CT 03/10/2018-subtle centrilobular groundglass opacities, air trapping in the upper lobes.  Tiny pulmonary nodules the largest is 3 mm.  High-res CT 09/01/2019- resolution of groundglass opacities.  Pulmonary nodules are stable.  High-res CT 04/11/2020-no interstitial lung disease, bronchial wall thickening, small hiatal hernia. I reviewed the images personally.  PFTs: Spirometry from [place], [place] 03/10/17 FVC 2.71 [71%], pre-FEV1 1.53 [54%], post FEV1 1.70 [59%], % change 11 F/F 63 ABG 04/07/2017-7.4 2/40/73 Moderate obstruction with improvement in flow rates especially mid flows post bronchodilator suggestive of small airways disease.  03/03/18 FVC 1.70 (41%], FEV1 1.00 (31%], F/F 59, TLC 101, RV/TLC 173%, DLCO 67% Severe obstruction with air trapping, moderate diffusion defect   11/11/18 FVC 2.51 [61%], FEV1 1.51 [47%], F/F 60, TLC 100%, DLCO 82% Severe obstruction.  04/30/2020 FVC 2.28 (56%), FEV1 1.38 (44%), F/F 61, TLC 4.96 [85%], DLCO 24.42 [101%] Severe obstruction with air trapping  FENO 11/18/17-13 FENO 01/06/18-14 FENO 04/04/2018-7  Labs: CBC 11/18/17-WBC 4.7, eosinophils 1.1%, absolute eos count 100 RAST panel 11/18/17-IgE 752, sensitive to multiple allergens including cats, dogs,  dust mite, pollen  Alpha-1 antitrypsin 03/03/2018-204, PI FM Alpha-1 antitrypsin 03/27/2020-123  Connective tissue serologies 04/04/2018-ANA, rheumatoid factor, CCP, hypersensitivity panel, double-stranded DNA, Ro, lab, SCL 70- negative  Bronchoscopy 04/15/2018 WBC 525, 11% lymphs, 89% monocyte macrophage Cultures- Negative Cytology-benign.  Path- benign lung and bronchial mucosa.  Sleep  Home sleep study 09/15/2019-AHI 3.5, desats to 79%  Assessment:  Severe COPD, asthmatic bronchitis PFTs reviewed with severe obstruction, air trapping.   Continue Trelegy inhaler.  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 [name].    GERD On omeprazole  Abnormal CT scan CT scan in 2019 reviewed with mild centrilobular nodules, air trapping.  Bronchoscopy and labs for connective tissue disease, hypersensitivity pneumonitis are negative There is no evidence of lymphocytosis or eosinophilia on BAL.  At this point I do not have a clear evidence of interstitial lung disease.  I suspect that the findings on CT scan may have been nonspecific inflammatory findings since  the scan was done as she was recovering from an exacerbation.  Repeat CT shows resolution of these abnormalities.  Lung nodules Stable since 2019 and likely benign. Will not require additional follow-up She does not meet criteria for screening CT chest  Health maintenance 05/04/2018-Pneumovax Up-to-date with Covid vaccine. Recommended her to get the booster  Plan/Recommendations: - Continue trelegy, duo nebs as needed  [name] MD [facility] 11/08/2020, 2:44 PM  CC: Olena Mater, MD

## 2020-11-08 NOTE — Patient Instructions (Signed)
Glad you are doing well with the breathing I will renew the Hycodan cough medication Follow-up in 6 months.

## 2020-11-13 IMAGING — CT CT CHEST HIGH RESOLUTION W/O CM
2 of 7 series · 15 of 36 positions shown, 18 images · non-contrast
Comparison: High-resolution chest CT 03/10/2018.

CLINICAL DATA: 58-year-old female with history of severe COPD.
Evaluate for interstitial lung disease.

EXAM:
CT CHEST WITHOUT CONTRAST
TECHNIQUE: Multidetector CT imaging of the chest was performed following the
standard protocol without intravenous contrast. High resolution
imaging of the lungs, as well as inspiratory and expiratory imaging,
was performed.

[Series 9: coronal · coronal · 0.58mm/px · 3 of 135 slices shown]
[im 27/135  lung]
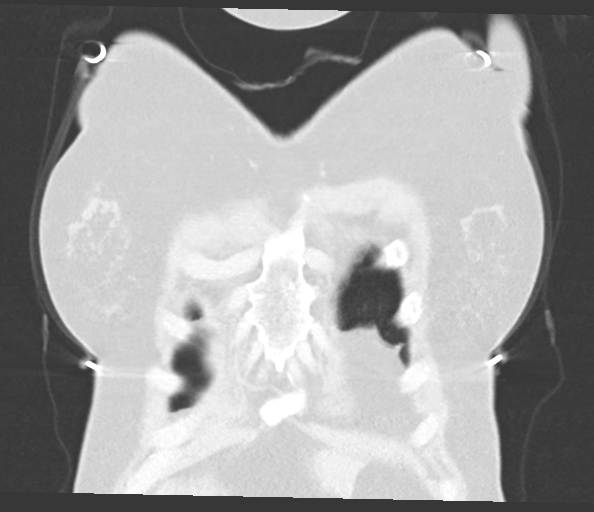
[im 54/135  lung]
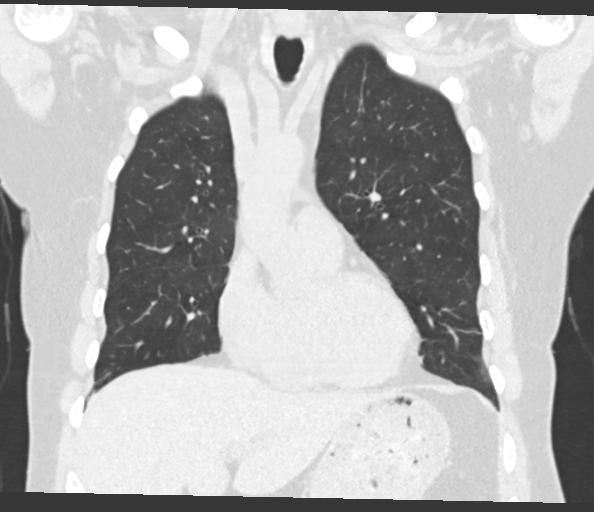
[im 81/135  lung]
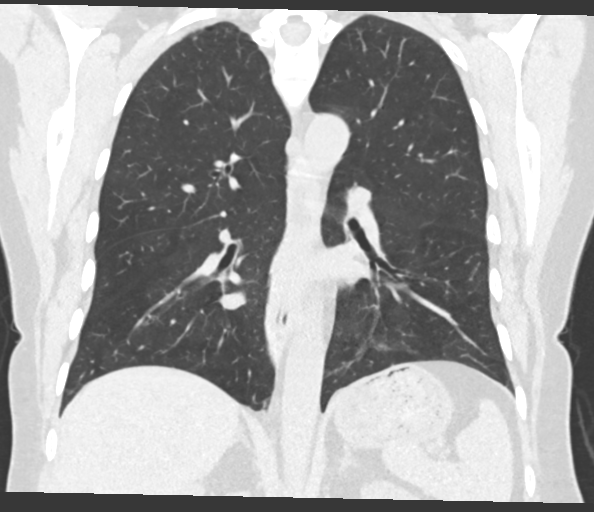

[Series 12: high res (id) · axial · 0.60mm/px · z∈[-254,-11]mm · 12 of 432 slices shown, 15 images]
[im 29/432  mediastinal]
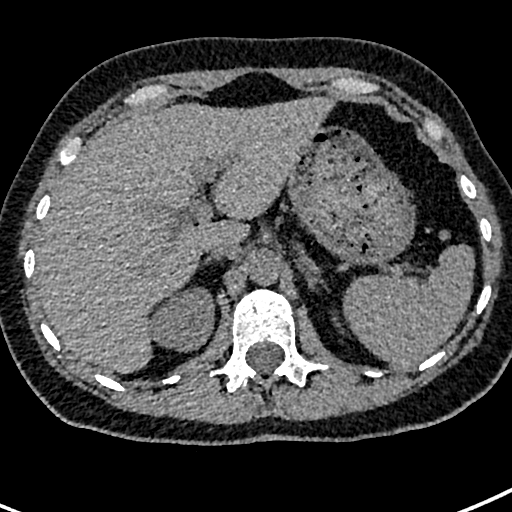
[im 29/432  lung]
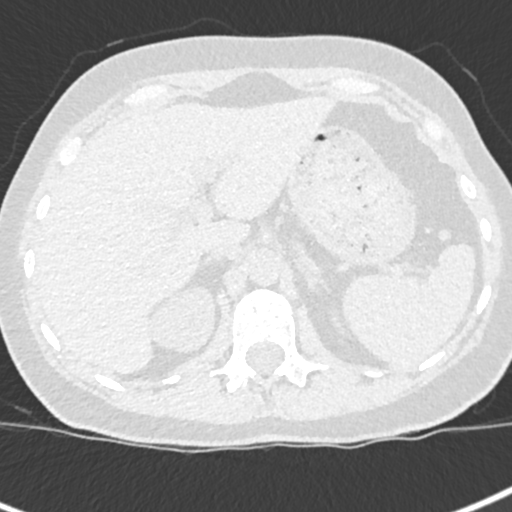
[im 58/432  lung]
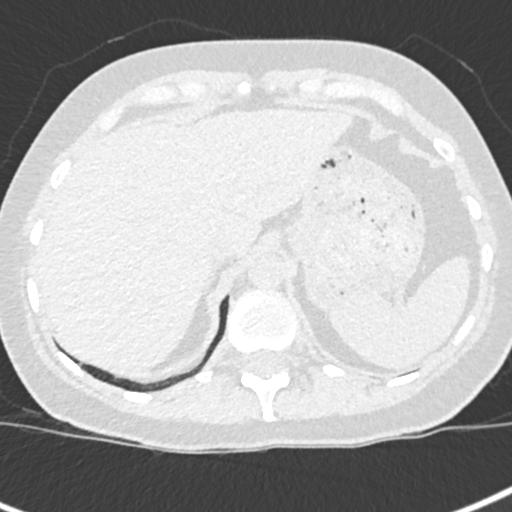
[im 87/432  lung]
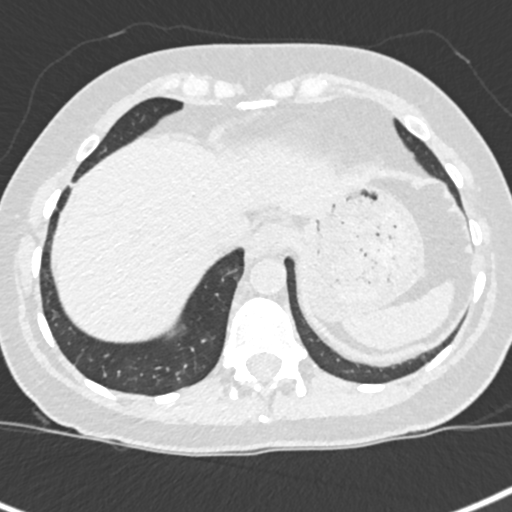
[im 144/432  lung]
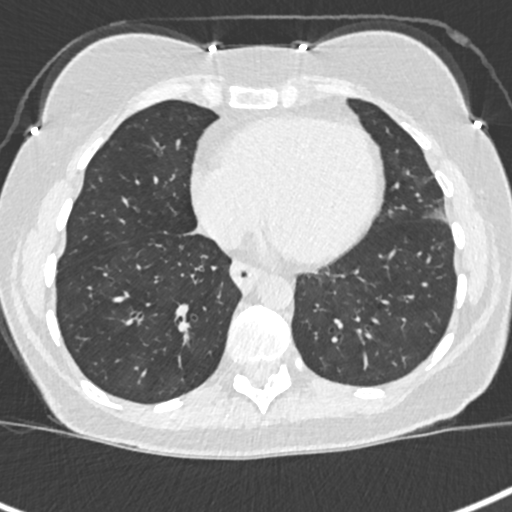
[im 173/432  mediastinal]
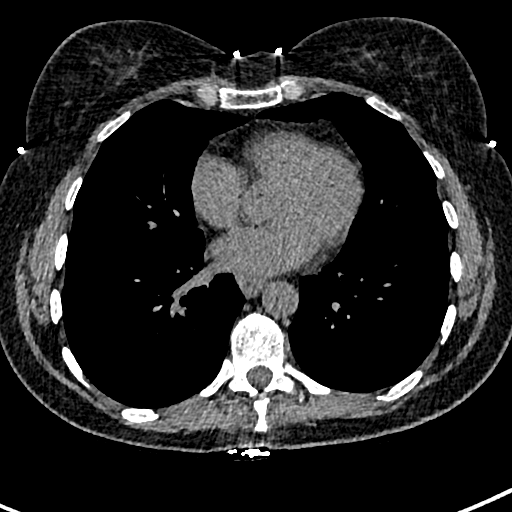
[im 173/432  lung]
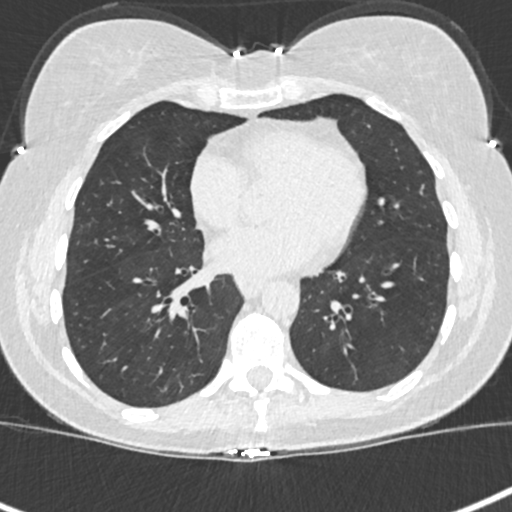
[im 202/432  lung]
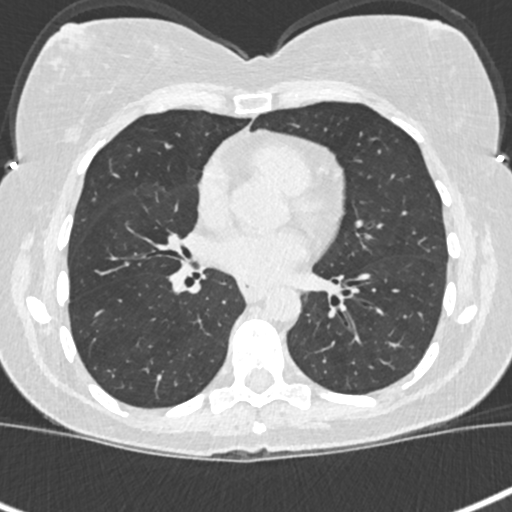
[im 230/432  lung]
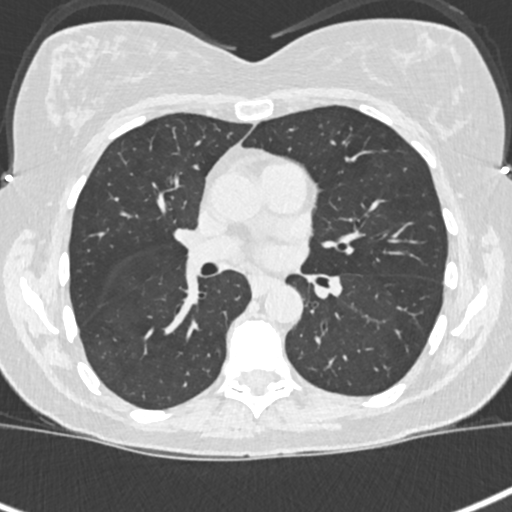
[im 259/432  lung]
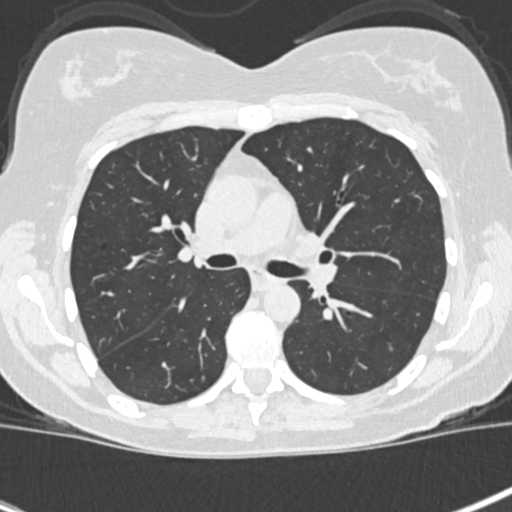
[im 288/432  mediastinal]
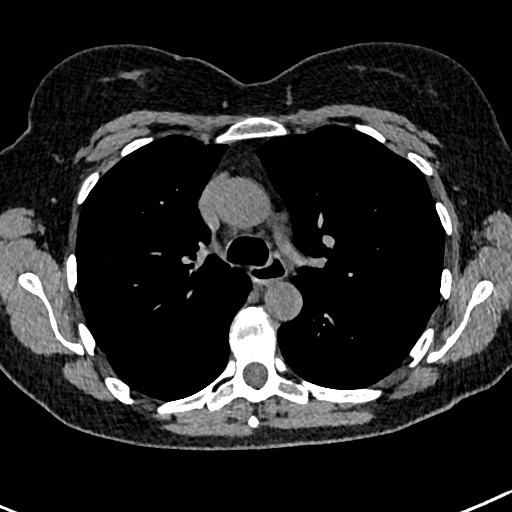
[im 288/432  lung]
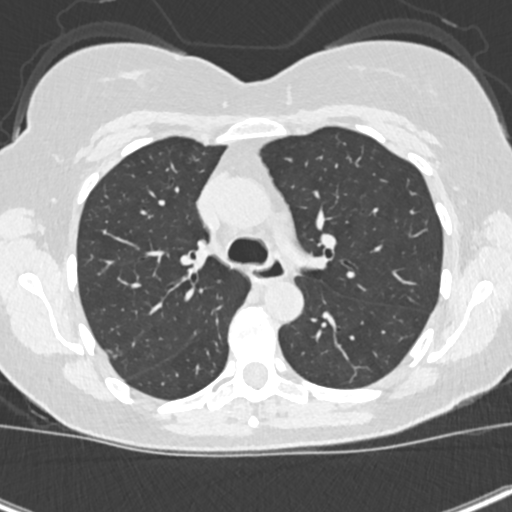
[im 345/432  lung]
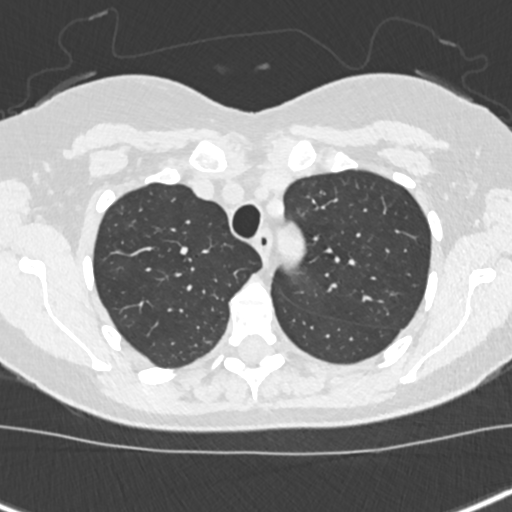
[im 374/432  lung]
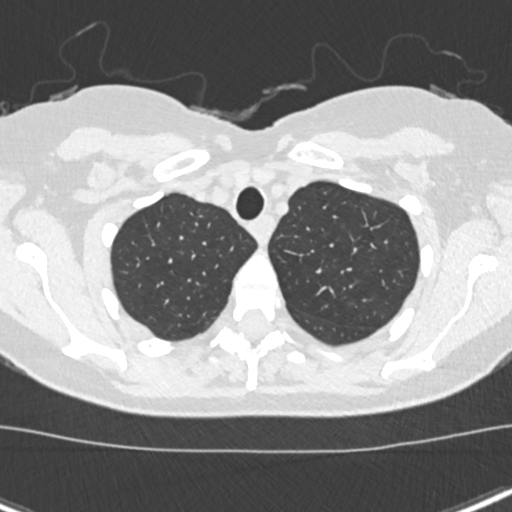
[im 403/432  lung]
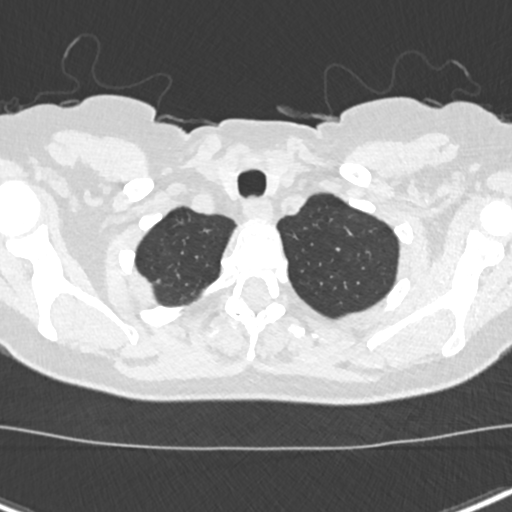

[15 of 36 positions shown; findings below may reference images not displayed]

FINDINGS: Cardiovascular: Heart size is normal. There is no significant
pericardial fluid, thickening or pericardial calcification. Aortic
atherosclerosis. No coronary artery calcifications.

Mediastinum/Nodes: No pathologically enlarged mediastinal or hilar
lymph nodes. Please note that accurate exclusion of hilar adenopathy
is limited on noncontrast CT scans. Esophagus is unremarkable in
appearance. No axillary lymphadenopathy.

Lungs/Pleura: High-resolution images demonstrate no significant
regions of ground-glass attenuation, septal thickening, subpleural
reticulation, parenchymal banding, traction bronchiectasis or frank
honeycombing. Inspiratory and expiratory imaging is unremarkable in
appearance. No acute consolidative airspace disease. No pleural
effusions. A few scattered tiny pulmonary nodules are noted in the
lungs bilaterally, largest of which measures only 3 mm in the right
upper lobe. No larger more suspicious appearing pulmonary nodules or
masses are noted. Mild post infectious or inflammatory scarring in
the inferior segment of the lingula.

Upper Abdomen: Unremarkable.

Musculoskeletal: There are no aggressive appearing lytic or blastic
lesions noted in the visualized portions of the skeleton.
IMPRESSION: 1. No findings to suggest interstitial lung disease.
2. Multiple tiny pulmonary nodules measuring 3 mm or less in size,
nonspecific but statistically likely benign. No follow-up needed if
patient is low-risk (and has no known or suspected primary
neoplasm). Non-contrast chest CT can be considered in 12 months if
patient is high-risk. This recommendation follows the consensus
statement: Guidelines for Management of Incidental Pulmonary Nodules
Detected on CT Images: From the [HOSPITAL] 9261; Radiology
9261; [DATE].
3. Aortic atherosclerosis.

Aortic Atherosclerosis (485CH-JLO.O).

## 2020-12-09 DIAGNOSIS — R059 Cough, unspecified: Secondary | ICD-10-CM

## 2020-12-09 NOTE — Telephone Encounter (Signed)
Dr. Isaiah Serge please advise on patient mychart message   Could you please ask Dr. Isaiah Serge to refill my prescription cough medicine.  He is currently treating me for stage 3 COPD with chronic cough.  Thank you.  [facility] in [place] pharmacy.

## 2020-12-10 ENCOUNTER — Other Ambulatory Visit: Payer: Self-pay | Admitting: Allergy & Immunology

## 2020-12-10 ENCOUNTER — Other Ambulatory Visit: Payer: Self-pay | Admitting: Pulmonary Disease

## 2020-12-10 MED ORDER — HYDROCODONE-HOMATROPINE 5-1.5 MG/5ML PO SYRP: 5.0000 mL | ORAL_SOLUTION | Freq: Four times a day (QID) | ORAL | 0 refills | Status: DC | PRN

## 2020-12-10 NOTE — Telephone Encounter (Signed)
Ok. Prescription has been sent in

## 2020-12-14 ENCOUNTER — Telehealth: Payer: Self-pay | Admitting: Internal Medicine

## 2020-12-14 MED ORDER — DEXAMETHASONE 6 MG PO TABS: 6.0000 mg | ORAL_TABLET | Freq: Every day | ORAL | 0 refills | Status: DC

## 2020-12-14 NOTE — Telephone Encounter (Signed)
Acute onset 1/7 fatigue /achy then bad chills cough overnight mucus is white.  No sob/  Uses 02 2lpm  94%  Last vaccination 01/2020 pfizer.  Normally doesn't need neb   [facility] this am in [place] >  zpak    rec  Finish zpak Dexamethasone 6 mg now and each am x 10 doses  duoneb up to every 4 hours for breating Increase the 02 for sat 90% but if can't keep over 90% at rest on max flow with concentrator then go to ER

## 2020-12-14 NOTE — Telephone Encounter (Signed)
Call pt 11/10 to check on her condition and whether or not she needs referral to [facility]

## 2020-12-16 NOTE — Telephone Encounter (Signed)
Spoke with the pt and notified of response per Dr Wert and she verbalized understanding. 

## 2020-12-16 NOTE — Telephone Encounter (Signed)
Can use nyquil at hs prn and daytime either delsym or mucinex dm but not both at same time

## 2020-12-16 NOTE — Telephone Encounter (Signed)
Spoke with the pt  She states feeling some better since spoke with Dr Melvyn Novas  She does not have fever today, but had one yesterday when she woke up of 100.0  She is not having any wheezing or SOB  She is still bothered by cough and congestion- clear sputum  She is on 3lpm and sats are staying above 90%  She is taking her zpack- today is day 3 and also dexamethasone  She feels that the dexamethasone is keeping her awake at night despite taking this in the am with breakfast  Please advise if any further recs, thanks!

## 2021-01-08 DIAGNOSIS — R059 Cough, unspecified: Secondary | ICD-10-CM

## 2021-01-08 NOTE — Telephone Encounter (Signed)
Dr. Vaughan Browner please advise on patient mychart message  Please ask Dr. Vaughan Browner to refill my prescription cough medicine.  He is treating me for stage 3 COPD with chronic cough.  Thank you so much.

## 2021-01-10 MED ORDER — HYDROCODONE-HOMATROPINE 5-1.5 MG/5ML PO SYRP: 5.0000 mL | ORAL_SOLUTION | Freq: Four times a day (QID) | ORAL | 0 refills | Status: DC | PRN

## 2021-01-10 NOTE — Telephone Encounter (Signed)
Ok. Done 

## 2021-02-06 DIAGNOSIS — R059 Cough, unspecified: Secondary | ICD-10-CM

## 2021-02-06 MED ORDER — HYDROCODONE-HOMATROPINE 5-1.5 MG/5ML PO SYRP: 5.0000 mL | ORAL_SOLUTION | Freq: Four times a day (QID) | ORAL | 0 refills | Status: DC | PRN

## 2021-02-06 NOTE — Telephone Encounter (Signed)
Patient is asking for a refill on Hycodan cough syrup. This was last filled on 01/10/2021 for 232mls. Patient's pharmacy is [facility] in [place].  Dr Vaughan Browner can you please advise.

## 2021-02-06 NOTE — Telephone Encounter (Signed)
Done

## 2021-03-07 DIAGNOSIS — R059 Cough, unspecified: Secondary | ICD-10-CM

## 2021-03-07 NOTE — Telephone Encounter (Signed)
Dr.Mannam please advise on patient mychart message  Could you please ask Dr Vaughan Browner to refill my prescription cough medicine? He's treating me for stage 3 COPD with chronic cough. Thank you.

## 2021-03-10 MED ORDER — HYDROCODONE-HOMATROPINE 5-1.5 MG/5ML PO SYRP: 5.0000 mL | ORAL_SOLUTION | Freq: Four times a day (QID) | ORAL | 0 refills | Status: DC | PRN

## 2021-03-10 NOTE — Telephone Encounter (Signed)
Ok. Refill has been sent in

## 2021-04-04 ENCOUNTER — Other Ambulatory Visit: Payer: Self-pay | Admitting: Allergy & Immunology

## 2021-04-07 MED ORDER — HYDROCODONE BIT-HOMATROP MBR 5-1.5 MG/5ML PO SOLN: 5.0000 mL | Freq: Four times a day (QID) | ORAL | 0 refills | Status: DC | PRN

## 2021-04-07 NOTE — Telephone Encounter (Signed)
Dr. Vaughan Browner please advise on the following My Chart message:  Could you please ask Dr Vaughan Browner to refill my prescription cough medicine. He is treating me for stage 3 COPD with chronic cough. Thank you.  Thank you Dr. Vaughan Browner

## 2021-04-07 NOTE — Telephone Encounter (Signed)
Prescription has been refilled.

## 2021-04-11 ENCOUNTER — Other Ambulatory Visit: Payer: Self-pay | Admitting: Allergy & Immunology

## 2021-05-06 NOTE — Telephone Encounter (Signed)
mychart message sent by pt which is posted below:  To: [facility]    From: Theresa Franco    Created: 05/06/2021 10:23 AM     *-*-*This message was handled on 05/06/2021 2:28 PM by Izaias Krupka P*-*-*  Could you please ask Dr Vaughan Browner to refill my prescription cough medicine? He is currently treating me for stage 3 COPD with chronic cough. Thank you.     Dr. Vaughan Browner, please advise.

## 2021-05-07 MED ORDER — HYDROCODONE BIT-HOMATROP MBR 5-1.5 MG/5ML PO SOLN: 5.0000 mL | Freq: Four times a day (QID) | ORAL | 0 refills | Status: DC | PRN

## 2021-05-07 NOTE — Telephone Encounter (Signed)
Ok. The prescription has been sent in

## 2021-05-15 ENCOUNTER — Telehealth: Payer: Self-pay | Admitting: Pulmonary Disease

## 2021-05-15 MED ORDER — PREDNISONE 20 MG PO TABS: 40.0000 mg | ORAL_TABLET | Freq: Every day | ORAL | 0 refills | Status: DC

## 2021-05-15 NOTE — Telephone Encounter (Signed)
Attempted to call pt but line went directly to VM. Left message for her to return call. °

## 2021-05-15 NOTE — Telephone Encounter (Signed)
Called and spoke with pt letting her know the recs stated by Dr. Vaughan Browner and she verbalized understanding. Verified preferred pharmacy and sent prednisone in for pt. Nothing further needed.

## 2021-05-15 NOTE — Telephone Encounter (Signed)
Called and spoke with pt who stated that she was first sick 2 weeks ago and went to UC, was tested for covid and that came back negative. Prescribed zpak and steroid shot done due to thinking it was URI/bronchitis.  Pt said she did eventually start to feel some better but states that she still has a cough, congestion, some fatigue, and occ SOB. Pt also states that she has had issues of tachycardia as well as feeling dizzy.  Pt said pulse was 123 and O2 sats were 96%.  Pt has not been doing strenous work at her job due to there only being one provider at her job yesterday 6/8.  States after work she rechecked and pulse was down but still in the 90s which is also why she has been feeling fatigued.  Due to all this, pt wants to know what could be recommended. Dr. Vaughan Browner, please advise.

## 2021-05-15 NOTE — Telephone Encounter (Signed)
Order additional prednisone 40mg /day for 5 days. No need for additional antibiotics

## 2021-05-26 ENCOUNTER — Telehealth: Payer: Self-pay | Admitting: Pulmonary Disease

## 2021-05-26 NOTE — Telephone Encounter (Signed)
Dr. Vaughan Browner,   This message was received this morning. Please advise.  I am flying to [place] on Thursday.  I need a portable 02 to take with me and I need a rx for evaluation.  Could you send it to [facility] in [place]? It needs to have dates on it . I'm traveling from 6-23 to 6-26. Thank you

## 2021-05-28 DIAGNOSIS — J849 Interstitial pulmonary disease, unspecified: Secondary | ICD-10-CM

## 2021-05-28 NOTE — Telephone Encounter (Signed)
Dr. Vaughan Browner, Patient has sent several messages requesting portable O2 to travel this week. Patient is leaving 05/29/21. Order needs dates of travel 05/29/21-06/01/21.   Message routed to Dr. Vaughan Browner to advise on oxygen concentrator for travel     I am flying to [place] on Thursday.  I need a portable 02 to take with me and I need a rx for evaluation.  Could you send it to [facility] in [place]? It needs to have dates on it . I'm traveling from 6-23 to 6-26. Thank you  Could you ask Dr. Vaughan Browner to send an updated rx for 02 for me to [facility], [place].  It's too late for the portable evaluation and I at least need my night time 02.  I will be flying to [place]. Thank you.

## 2021-05-28 NOTE — Telephone Encounter (Signed)
Message routed to Benjamin Perez, NP to advise

## 2021-05-28 NOTE — Telephone Encounter (Signed)
I can sign oxygen order, please place under my name

## 2021-05-28 NOTE — Telephone Encounter (Signed)
I can sign

## 2021-05-29 NOTE — Telephone Encounter (Signed)
Lmtcb for pt.  

## 2021-06-02 ENCOUNTER — Other Ambulatory Visit: Payer: Self-pay | Admitting: Allergy & Immunology

## 2021-06-02 MED ORDER — HYDROCODONE BIT-HOMATROP MBR 5-1.5 MG/5ML PO SOLN: 5.0000 mL | Freq: Four times a day (QID) | ORAL | 0 refills | Status: DC | PRN

## 2021-06-02 NOTE — Telephone Encounter (Signed)
Dr Vaughan Browner, pt is requesting refill on her hycodan- last rx #240 ml on 05/07/21 She uses [facility]

## 2021-06-02 NOTE — Telephone Encounter (Unsigned)
Ok done

## 2021-06-05 NOTE — Telephone Encounter (Signed)
Called and spoke to pt. Pt states she has already traveled and come back and no longer needs the POC at this time. Pt only uses nocturnal O2 and is aware the POC cost would be out of pocket. Pt states if she decides she wants to do this in the future she will call us in advance. Nothing further needed at this time.

## 2021-06-16 ENCOUNTER — Other Ambulatory Visit: Payer: Self-pay

## 2021-06-16 ENCOUNTER — Ambulatory Visit (INDEPENDENT_AMBULATORY_CARE_PROVIDER_SITE_OTHER): Payer: 59 | Admitting: Family Medicine

## 2021-06-16 VITALS — BP 90/40 | HR 120 | Resp 20

## 2021-06-16 DIAGNOSIS — R55 Syncope and collapse: Secondary | ICD-10-CM | POA: Diagnosis not present

## 2021-06-16 DIAGNOSIS — R42 Dizziness and giddiness: Secondary | ICD-10-CM

## 2021-06-16 DIAGNOSIS — I951 Orthostatic hypotension: Secondary | ICD-10-CM | POA: Diagnosis not present

## 2021-06-16 NOTE — Progress Notes (Signed)
Subjective:    Patient ID: Theresa Franco, female    DOB: November 16, 1961, 60 y.o.   MRN: [id number]  HPI Patient is a 60 year old Caucasian female who works in my office in the lab.  She sees another primary care physician.  However my staff urgently came to get me due to the fact she began to feel ill.  She was out all last week with COVID.  Today she was working in the lab drawing blood.  She states that whenever she stands up she feels like she is going to "pass out".  When I first evaluated the patient she was profoundly tachycardic.  She was standing.  I checked her blood pressure standing and her heart rate was over 120.  I had a very difficult time even hearing her blood pressure but I got a systolic blood pressure around 90.  When I had the patient lie down, her heart rate dropped to 105 and her blood pressure improved to 130/90.  She denies any vertigo.  She denies any room spinning.  There is no nystagmus on her exam.  Dix-Hallpike maneuver is negative however she does feel like she is going to pass out when she sits up.  She denies any chest pain or pleurisy.  There is no peripheral edema to suggest a DVT or pulmonary embolism however she does appear to be profoundly hypotensive when sitting or standing.  She did take losartan this morning for her blood pressure.  EKG today shows sinus tachycardia with mild ST segment depression in every lead.  There is no ST segment elevation.  There is no sign of SVT or atrial fibrillation or RVR or wide-complex tachycardia. Past Medical History:  Diagnosis Date   Asthma    Hypertension    Past Surgical History:  Procedure Laterality Date   BRONCHOSCOPY  08/2017   TUBAL LIGATION     VIDEO BRONCHOSCOPY Bilateral 04/15/2018   Procedure: VIDEO BRONCHOSCOPY WITH FLUORO;  Surgeon: [name], MD;  Location: [facility];  Service: Cardiopulmonary;  Laterality: Bilateral;   Current Outpatient Medications on File Prior to Visit  Medication Sig Dispense  Refill   albuterol (PROAIR HFA) 108 (90 Base) MCG/ACT inhaler Inhale 2 puffs into the lungs every 6 (six) hours as needed for wheezing or shortness of breath. 1 Inhaler 1   ALPRAZolam (XANAX) 0.5 MG tablet Take 0.5 mg by mouth 3 (three) times daily as needed for anxiety.      Azelastine-Fluticasone 137-50 MCG/ACT SUSP Use 1 spray(s) in each nostril twice daily 23 g 2   buPROPion (WELLBUTRIN SR) 150 MG 12 hr tablet Take 150 mg by mouth 2 (two) times daily.     cetirizine (ZYRTEC) 10 MG tablet Take 1 tablet (10 mg total) by mouth daily. 30 tablet 5   gabapentin (NEURONTIN) 300 MG capsule Take 300 mg by mouth at bedtime.      HYDROcodone bit-homatropine (HYCODAN) 5-1.5 MG/5ML syrup Take 5 mLs by mouth every 6 (six) hours as needed for cough. 240 mL 0   ibuprofen (ADVIL,MOTRIN) 200 MG tablet Take 400 mg by mouth every 6 (six) hours as needed for headache or moderate pain.     ipratropium (ATROVENT) 0.03 % nasal spray USE 2 SPRAY(S) IN EACH NOSTRIL 2 TO 3 TIMES DAILY AS NEEDED FOR RUNNY NOSE 30 mL 0   ipratropium-albuterol (DUONEB) 0.5-2.5 (3) MG/3ML SOLN Take 3 mLs by nebulization every 6 (six) hours as needed. (Patient taking differently: Take 3 mLs by nebulization every 6 (six)  hours as needed (for shortness of breath or wheezing). ) 360 mL 3   losartan (COZAAR) 50 MG tablet Take 50 mg by mouth daily.  0   losartan-hydrochlorothiazide (HYZAAR) 50-12.5 MG tablet Take 1 tablet by mouth daily.     montelukast (SINGULAIR) 10 MG tablet Take 1 tablet by mouth once daily 30 tablet 0   omeprazole (PRILOSEC) 40 MG capsule TAKE 1 CAPSULE BY MOUTH EVERY DAY 90 capsule 2   predniSONE (DELTASONE) 20 MG tablet Take 2 tablets (40 mg total) by mouth daily with breakfast. 10 tablet 0   Respiratory Therapy Supplies (FLUTTER) DEVI Use as directed. 1 each 0   TRELEGY ELLIPTA 100-62.5-25 MCG/INH AEPB INHALE 1 PUFF ONCE DAILY 60 each 2   No current facility-administered medications on file prior to visit.    Marland Kitchenall Social History   Socioeconomic History   Marital status: Married    Spouse name: Not on file   Number of children: Not on file   Years of education: Not on file   Highest education level: Not on file  Occupational History   Not on file  Tobacco Use   Smoking status: Former    Packs/day: 0.50    Years: 11.00    Pack years: 5.50    Types: Cigarettes    Start date: 50    Quit date: 11/18/1992    Years since quitting: 28.5   Smokeless tobacco: Never  Vaping Use   Vaping Use: Never used  Substance and Sexual Activity   Alcohol use: Yes    Comment: 3/week   Drug use: No   Sexual activity: Not on file  Other Topics Concern   Not on file  Social History Narrative   Not on file   Social Determinants of Health   Financial Resource Strain: Not on file  Food Insecurity: Not on file  Transportation Needs: Not on file  Physical Activity: Not on file  Stress: Not on file  Social Connections: Not on file  Intimate Partner Violence: Not on file      Review of Systems  All other systems reviewed and are negative.     Objective:   Physical Exam Vitals reviewed.  Constitutional:      Appearance: She is normal weight. She is ill-appearing.  HENT:     Head: Normocephalic and atraumatic.  Cardiovascular:     Rate and Rhythm: Regular rhythm. Tachycardia present.     Heart sounds: Normal heart sounds. No murmur heard.   No friction rub. No gallop.  Pulmonary:     Effort: Pulmonary effort is normal. No respiratory distress.     Breath sounds: Normal breath sounds. No stridor. No wheezing or rales.  Abdominal:     General: Abdomen is flat. Bowel sounds are normal.     Palpations: Abdomen is soft.  Musculoskeletal:     Cervical back: Neck supple.  Neurological:     General: No focal deficit present.     Mental Status: She is alert and oriented to person, place, and time. Mental status is at baseline.     Cranial Nerves: No cranial nerve deficit.     Motor: No  weakness.  Psychiatric:        Mood and Affect: Mood is anxious.          Assessment & Plan:  Dizzy - Plan: EKG 12-Lead  Orthostatic hypotension  Postural dizziness with presyncope  At this point, I believe the patient has orthostatic hypotension secondary to dehydration with  near syncope due to the drop in her blood pressure.  I believe this triggered a panic attack which led to tachycardia.  Therefore I will start the patient on IV fluids and give her 1 L of normal saline if possible and then reassess the patient.  Also on the differential diagnosis would be vertigo however I believe the patient is describing presyncope rather than vertigo and her Dix-Hallpike maneuver is negative.  Given her recent COVID, a pulmonary embolism can certainly cause hypotension and near syncope however she has no peripheral edema on exam today and she denies any chest pain.  While lying down, she feels better and denies any shortness of breath lying down therefore I feel that this is unlikely.  Patient was monitored in the office for several hours.  At 130, her blood pressure was finally up to 120/80 while sitting.  This was drastically improved from earlier this afternoon when it was 90/40 and she was tachycardic.  However she received 2 L of IV fluid.  Therefore I recommended that she go home and discontinue her blood pressure medicines until she speaks with her doctor.  I believe most likely the cause of her dizziness and presyncope is dehydration and hypotension.  I encouraged her to drink plenty of Gatorade and also eat sodium containing foods such as canned soup.  Recheck immediately if symptoms worsen or return or change in any way.

## 2021-06-24 NOTE — Telephone Encounter (Signed)
Patient requested a refill of Hycodan for her cough. Last refill was 06/02/21, 263ml.  Message routed to Dr Vaughan Browner

## 2021-06-25 ENCOUNTER — Other Ambulatory Visit: Payer: Self-pay | Admitting: Pulmonary Disease

## 2021-06-25 MED ORDER — HYDROCODONE BIT-HOMATROP MBR 5-1.5 MG/5ML PO SOLN: 5.0000 mL | Freq: Four times a day (QID) | ORAL | 0 refills | Status: DC | PRN

## 2021-06-25 NOTE — Telephone Encounter (Signed)
I have refilled the med

## 2021-06-30 ENCOUNTER — Other Ambulatory Visit: Payer: Self-pay

## 2021-06-30 ENCOUNTER — Ambulatory Visit (INDEPENDENT_AMBULATORY_CARE_PROVIDER_SITE_OTHER): Payer: 59 | Admitting: Pulmonary Disease

## 2021-06-30 ENCOUNTER — Encounter: Payer: Self-pay | Admitting: Pulmonary Disease

## 2021-06-30 VITALS — BP 116/74 | HR 73 | Temp 98.0°F | Ht 70.0 in | Wt 171.6 lb

## 2021-06-30 DIAGNOSIS — J449 Chronic obstructive pulmonary disease, unspecified: Secondary | ICD-10-CM

## 2021-06-30 DIAGNOSIS — R059 Cough, unspecified: Secondary | ICD-10-CM

## 2021-06-30 MED ORDER — TRELEGY ELLIPTA 100-62.5-25 MCG/INH IN AEPB: 1.0000 | INHALATION_SPRAY | Freq: Every day | RESPIRATORY_TRACT | 0 refills | Status: DC

## 2021-06-30 NOTE — Addendum Note (Signed)
Addended by: Valerie Salts on: 06/30/2021 11:38 AM   Modules accepted: Orders

## 2021-06-30 NOTE — Patient Instructions (Signed)
I am glad you are doing well with regard to your breathing Continue Trelegy inhaler Follow-up in 6 months

## 2021-06-30 NOTE — Progress Notes (Signed)
Theresa Franco    [id number]    [birth year]  Primary Care Physician:Zimmer, [name], MD  Referring Physician: Olena Mater, MD [address] [place] [address]  Chief complaint: Follow up for cough, COPD, asthma.  HPI: 60 year old with past medical history of hypertension, asthma, allergies.  She has history of chronic cough for several years.  She is evaluated by Dr. [name], Pulmonologist at [place], [place].  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  She was given Xolair but had to stop after 1 dose as she developed severe hypertension.  She has been tried on breo, trelegy without any improvement in symptoms.  Her peripheral blood count does not show any eosinophilia and she was not a candidate for anti-IL5 therapy.  As per the patient she underwent a bronchoscopy in September 2018 to evaluate for endobronchial lesions which did not show any abnormalities. Medications significant for losartan.  She had been off this for > 1 year with no change in cough.  It had to be restarted after she developed hypertension after Xolair.  She has cough which is mostly nonproductive in nature.  This is more at night and sometimes keeps her up.  She has dyspnea with exertion.  Denies dyspnea at rest, no wheezing, fevers, chills.  Denies any heartburn symptoms.  Followed by Dr. [name], Allergy.  Started on [place] therapy in late 2019 but stopped after a few doses as it did not make a difference and she was afraid of lowering her immunity and getting Covid CT in April 2019 shows centrilobular nodules for which she underwent bronchoscopy.  Results are nondiagnostic. She had a COPD exacerbation in early 2020 which was treated with antibiotics and steroids  Pets: 2 dogs but no birds, cats Occupation: Medical lab tech Exposures: No exposure, no mold at home Smoking history: 10-pack-year history.  Quit in 1993. Travel History: Not significant  Interim  history: Developed COVID-19 in January and July 2022.  She did not require hospitalization She also required 2 rounds of prednisone this year  Currently breathing is at baseline, has chronic cough with white mucus Continues on Trelegy inhaler  Outpatient Encounter Medications as of 06/30/2021  Medication Sig   albuterol (PROAIR HFA) 108 (90 Base) MCG/ACT inhaler Inhale 2 puffs into the lungs every 6 (six) hours as needed for wheezing or shortness of breath.   ALPRAZolam (XANAX) 0.5 MG tablet Take 0.5 mg by mouth 3 (three) times daily as needed for anxiety.    Azelastine-Fluticasone 137-50 MCG/ACT SUSP Use 1 spray(s) in each nostril twice daily   buPROPion (WELLBUTRIN SR) 150 MG 12 hr tablet Take 150 mg by mouth 2 (two) times daily.   cetirizine (ZYRTEC) 10 MG tablet Take 1 tablet (10 mg total) by mouth daily.   gabapentin (NEURONTIN) 300 MG capsule Take 300 mg by mouth at bedtime.    HYDROcodone bit-homatropine (HYCODAN) 5-1.5 MG/5ML syrup Take 5 mLs by mouth every 6 (six) hours as needed for cough.   ibuprofen (ADVIL,MOTRIN) 200 MG tablet Take 400 mg by mouth every 6 (six) hours as needed for headache or moderate pain.   ipratropium (ATROVENT) 0.03 % nasal spray USE 2 SPRAY(S) IN EACH NOSTRIL 2 TO 3 TIMES DAILY AS NEEDED FOR RUNNY NOSE   ipratropium-albuterol (DUONEB) 0.5-2.5 (3) MG/3ML SOLN Take 3 mLs by nebulization every 6 (six) hours as needed. (Patient taking differently: Take 3 mLs by nebulization every 6 (six) hours as needed (for  shortness of breath or wheezing).)   losartan-hydrochlorothiazide (HYZAAR) 50-12.5 MG tablet Take 1 tablet by mouth daily.   montelukast (SINGULAIR) 10 MG tablet Take 1 tablet by mouth once daily   omeprazole (PRILOSEC) 40 MG capsule TAKE 1 CAPSULE BY MOUTH EVERY DAY   TRELEGY ELLIPTA 100-62.5-25 MCG/INH AEPB INHALE 1 PUFF ONCE DAILY   Respiratory Therapy Supplies (FLUTTER) DEVI Use as directed.   [DISCONTINUED] losartan (COZAAR) 50 MG tablet Take 50 mg by  mouth daily.   [DISCONTINUED] predniSONE (DELTASONE) 20 MG tablet Take 2 tablets (40 mg total) by mouth daily with breakfast.   No facility-administered encounter medications on file as of 06/30/2021.   Physical Exam: Blood pressure 116/74, pulse 73, temperature 98 F (36.7 C), temperature source Temporal, height _0  (1.778 m), weight 171 lb 9.6 oz (77.8 kg), SpO2 99 %. Gen:      No acute distress HEENT:  EOMI, sclera anicteric Neck:     No masses; no thyromegaly Lungs:    Clear to auscultation bilaterally; normal respiratory effort CV:         Regular rate and rhythm; no murmurs Abd:      + bowel sounds; soft, non-tender; no palpable masses, no distension Ext:    No edema; adequate peripheral perfusion Skin:      Warm and dry; no rash Neuro: alert and oriented x 3 Psych: normal mood and affect   Data Reviewed: Imaging: High-resolution CT 03/10/2018-subtle centrilobular groundglass opacities, air trapping in the upper lobes.  Tiny pulmonary nodules the largest is 3 mm.  High-res CT 09/01/2019- resolution of groundglass opacities.  Pulmonary nodules are stable.  High-res CT 04/11/2020-no interstitial lung disease, bronchial wall thickening, small hiatal hernia. I reviewed the images personally.  PFTs: Spirometry from [place], [place] 03/10/17 FVC 2.71 [71%], pre-FEV1 1.53 [54%], post FEV1 1.70 [59%], % change 11 F/F 63 ABG 04/07/2017-7.4 2/40/73 Moderate obstruction with improvement in flow rates especially mid flows post bronchodilator suggestive of small airways disease.  03/03/18 FVC 1.70 (41%], FEV1 1.00 (31%], F/F 59, TLC 101, RV/TLC 173%, DLCO 67% Severe obstruction with air trapping, moderate diffusion defect   11/11/18 FVC 2.51 [61%], FEV1 1.51 [47%], F/F 60, TLC 100%, DLCO 82% Severe obstruction.  04/30/2020 FVC 2.28 (56%), FEV1 1.38 (44%), F/F 61, TLC 4.96 [85%], DLCO 24.42 [101%] Severe obstruction with air trapping  FENO 11/18/17-13 FENO 01/06/18-14 FENO  04/04/2018-7  Labs: CBC 11/18/17-WBC 4.7, eosinophils 1.1%, absolute eos count 100 RAST panel 11/18/17-IgE 752, sensitive to multiple allergens including cats, dogs, dust mite, pollen  Alpha-1 antitrypsin 03/03/2018-204, PI FM Alpha-1 antitrypsin 03/27/2020-123  Connective tissue serologies 04/04/2018-ANA, rheumatoid factor, CCP, hypersensitivity panel, double-stranded DNA, Ro, lab, SCL 70- negative  Bronchoscopy 04/15/2018 WBC 525, 11% lymphs, 89% monocyte macrophage Cultures- Negative Cytology-benign.  Path- benign lung and bronchial mucosa.  Sleep  Home sleep study 09/15/2019-AHI 3.5, desats to 79%  Assessment:  Severe COPD, asthmatic bronchitis PFTs show severe obstruction, air trapping.   Continue Trelegy inhaler. 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 [name].    GERD On omeprazole  Abnormal CT scan CT scan in 2019 reviewed with mild centrilobular nodules, air trapping.  Bronchoscopy and labs for connective tissue disease, hypersensitivity pneumonitis are negative There is no evidence of lymphocytosis or eosinophilia on BAL.  At this  point I do not have a clear evidence of interstitial lung disease.  I suspect that the findings on CT scan may have been nonspecific inflammatory findings since the scan was done as she was recovering from an exacerbation.  Repeat CT shows resolution of these abnormalities.  Lung nodules Stable since 2019 and likely benign. Will not require additional follow-up She does not meet criteria for screening CT chest  Health maintenance 05/04/2018-Pneumovax Up-to-date with Covid vaccine. Recommended her to get the booster  Plan/Recommendations: - Continue Trelegy, duo nebs  [name] MD [place]  [facility] 06/30/2021, 9:40 AM  CC: Olena Mater, MD

## 2021-07-25 MED ORDER — HYDROCODONE BIT-HOMATROP MBR 5-1.5 MG/5ML PO SOLN: 5.0000 mL | Freq: Four times a day (QID) | ORAL | 0 refills | Status: DC | PRN

## 2021-07-25 NOTE — Telephone Encounter (Signed)
Hello Dr. Vaughan Browner, please advise on mychart message, thanks!:  Could you please ask Dr Vaughan Browner to refill my prescription cough medicine? He is currently treating me for stage 3 COPD with chronic cough.  Thank you.

## 2021-07-25 NOTE — Telephone Encounter (Signed)
Ok. Prescription has been sent

## 2021-08-20 ENCOUNTER — Other Ambulatory Visit: Payer: Self-pay | Admitting: Allergy & Immunology

## 2021-08-21 NOTE — Telephone Encounter (Signed)
Dr. Vaughan Browner, Patient is requesting a refill of her Hycodan cough syrup.  Please advise.  Thank you.

## 2021-08-25 MED ORDER — HYDROCODONE BIT-HOMATROP MBR 5-1.5 MG/5ML PO SOLN: 5.0000 mL | Freq: Four times a day (QID) | ORAL | 0 refills | Status: DC | PRN

## 2021-08-25 NOTE — Telephone Encounter (Signed)
I have renewed the medication. Thanks

## 2021-09-23 NOTE — Telephone Encounter (Signed)
Hycodan order pended

## 2021-09-24 NOTE — Telephone Encounter (Signed)
PM pt has sent multiple messages to the office to get her Hycodan refilled.  She is still requesting that this refill be sent to her pharmacy.  Please advise. Thanks

## 2021-09-24 NOTE — Telephone Encounter (Signed)
I don't have imprivata access to send prescription online and am working with IT to get it restored. In the meantime I can give a signed prescription to get the med refilled if she want to pick it up from the clinic tomorrow.

## 2021-09-24 NOTE — Telephone Encounter (Signed)
PM the pt would like to pick this rx up on her lunch break tomorrow.  thanks

## 2021-09-25 MED ORDER — HYDROCODONE BIT-HOMATROP MBR 5-1.5 MG/5ML PO SOLN: 5.0000 mL | Freq: Four times a day (QID) | ORAL | 0 refills | Status: DC | PRN

## 2021-10-22 MED ORDER — HYDROCODONE BIT-HOMATROP MBR 5-1.5 MG/5ML PO SOLN: 5.0000 mL | Freq: Four times a day (QID) | ORAL | 0 refills | Status: DC | PRN

## 2021-10-24 ENCOUNTER — Telehealth: Payer: Self-pay | Admitting: Pulmonary Disease

## 2021-10-24 NOTE — Telephone Encounter (Signed)
Call made to pharmacy, confirmed patient DOB. They do not have the script for the Hycodan that was sent 11/16.

## 2021-10-24 NOTE — Telephone Encounter (Signed)
See MyChart message from 10/22/21. Message has already been sent to Dr. Vaughan Browner.

## 2021-10-27 MED ORDER — HYDROCODONE BIT-HOMATROP MBR 5-1.5 MG/5ML PO SOLN: 5.0000 mL | Freq: Four times a day (QID) | ORAL | 0 refills | Status: DC | PRN

## 2021-10-27 NOTE — Telephone Encounter (Signed)
The refill for hycodan has been sent to pharmacy

## 2021-11-04 ENCOUNTER — Encounter (INDEPENDENT_AMBULATORY_CARE_PROVIDER_SITE_OTHER): Payer: Self-pay | Admitting: *Deleted

## 2021-11-24 ENCOUNTER — Encounter: Payer: Self-pay | Admitting: Pulmonary Disease

## 2021-11-24 MED ORDER — HYDROCODONE BIT-HOMATROP MBR 5-1.5 MG/5ML PO SOLN: 5.0000 mL | Freq: Four times a day (QID) | ORAL | 0 refills | Status: DC | PRN

## 2021-11-24 NOTE — Telephone Encounter (Signed)
PM please advise on the message:  RX has been pended.   Could you please ask Dr.Mannam to refill my prescription cough medication.  He is treating me for stage 3 COPD with chronic cough.  Thank you.  Send to [facility].

## 2021-11-24 NOTE — Telephone Encounter (Signed)
Cough syrup has been renewed

## 2021-12-23 ENCOUNTER — Encounter: Payer: Self-pay | Admitting: Pulmonary Disease

## 2021-12-23 NOTE — Telephone Encounter (Signed)
Dr. Vaughan Browner please advise on patient mychart message  Could you please ask Dr. Vaughan Browner to refill my prescription cough medication? He is treating me for stage three COPD with chronic cough. Thank you. [facility] in [place] is my pharmacy

## 2021-12-24 MED ORDER — HYDROCODONE BIT-HOMATROP MBR 5-1.5 MG/5ML PO SOLN: 5.0000 mL | Freq: Four times a day (QID) | ORAL | 0 refills | Status: DC | PRN

## 2021-12-24 NOTE — Telephone Encounter (Signed)
I have renewed

## 2022-01-22 ENCOUNTER — Encounter: Payer: Self-pay | Admitting: Pulmonary Disease

## 2022-01-22 NOTE — Telephone Encounter (Signed)
Dr. Vaughan Browner, please advise on pt's email. She is requesting a refill of her Hycodan.  Last filled on 12/24/21 with 275ml.  Last seen on 06/30/2021 and advised to follow up in 6 months. Pt does not have a pending appt at this time.  Thank you!

## 2022-01-23 MED ORDER — HYDROCODONE BIT-HOMATROP MBR 5-1.5 MG/5ML PO SOLN: 5.0000 mL | Freq: Four times a day (QID) | ORAL | 0 refills | Status: DC | PRN

## 2022-01-23 NOTE — Telephone Encounter (Signed)
Renewal as been sent to pharmacy

## 2022-02-19 ENCOUNTER — Encounter: Payer: Self-pay | Admitting: Pulmonary Disease

## 2022-02-23 NOTE — Telephone Encounter (Signed)
Dr. Vaughan Browner, ?Patient is asking about refill of hycodan dough syrup.  She has an OV with you on Wednesday 02/25/22.  Please advise.  Thank you. ?

## 2022-02-24 MED ORDER — HYDROCODONE BIT-HOMATROP MBR 5-1.5 MG/5ML PO SOLN: 5.0000 mL | Freq: Four times a day (QID) | ORAL | 0 refills | Status: DC | PRN

## 2022-02-25 ENCOUNTER — Encounter: Payer: Self-pay | Admitting: Pulmonary Disease

## 2022-02-25 ENCOUNTER — Other Ambulatory Visit: Payer: Self-pay

## 2022-02-25 ENCOUNTER — Ambulatory Visit (INDEPENDENT_AMBULATORY_CARE_PROVIDER_SITE_OTHER): Payer: 59 | Admitting: Pulmonary Disease

## 2022-02-25 ENCOUNTER — Ambulatory Visit (INDEPENDENT_AMBULATORY_CARE_PROVIDER_SITE_OTHER): Payer: 59

## 2022-02-25 VITALS — BP 118/70 | HR 77 | Temp 98.1°F | Ht 70.0 in | Wt 172.2 lb

## 2022-02-25 DIAGNOSIS — J449 Chronic obstructive pulmonary disease, unspecified: Secondary | ICD-10-CM | POA: Diagnosis not present

## 2022-02-25 NOTE — Progress Notes (Signed)
? ?      ?Theresa Franco    [id number]    12/25/1960 ? ?Primary Care Physician:Zimmer, [name], MD ? ?Referring Physician: Olena Mater, MD ?[address] ?[place] [address] ? ?Chief complaint: Follow up for cough, COPD, asthma. ? ?HPI: ?61 year old with past medical history of hypertension, asthma, allergies.  She has history of chronic cough for several years.  She is evaluated by Dr. [name], Pulmonologist at [place], [place].  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  She was given Xolair but had to stop after 1 dose as she developed severe hypertension.  She has been tried on breo, trelegy without any improvement in symptoms.  Her peripheral blood count does not show any eosinophilia and she was not a candidate for anti-IL5 therapy.  As per the patient she underwent a bronchoscopy in September 2018 to evaluate for endobronchial lesions which did not show any abnormalities. ?Medications significant for losartan.  She had been off this for > 1 year with no change in cough.  It had to be restarted after she developed hypertension after Xolair. ? ?She has cough which is mostly nonproductive in nature.  This is more at night and sometimes keeps her up.  She has dyspnea with exertion.  Denies dyspnea at rest, no wheezing, fevers, chills.  Denies any heartburn symptoms. ? ?Followed by Dr. [name], Allergy.  Started on Dupixent therapy in late 2019 but stopped after a few doses as it did not make a difference and she was afraid of lowering her immunity and getting Covid ?CT in April 2019 shows centrilobular nodules for which she underwent bronchoscopy.  Results are nondiagnostic. ?She had a COPD exacerbation in early 2020 which was treated with antibiotics and steroids. ? ?Developed COVID-19 in January and July 2022.  She did not require hospitalization ?She also required 2 rounds of prednisone on 2022 ? ?Pets: 2 dogs but no birds, cats ?Occupation: Scientist, forensic ?Exposures: No  exposure, no mold at home ?Smoking history: 10-pack-year history.  Quit in 1993. ?Travel History: Not significant ? ?Interim history: ?So far this year she is doing well with no issues.  Breathing is stable ?Continues on Trelegy inhaler.  She is using Hycodan for cough ? ?Outpatient Encounter Medications as of 02/25/2022  ?Medication Sig  ? albuterol (PROAIR HFA) 108 (90 Base) MCG/ACT inhaler Inhale 2 puffs into the lungs every 6 (six) hours as needed for wheezing or shortness of breath.  ? ALPRAZolam (XANAX) 0.5 MG tablet Take 0.5 mg by mouth 3 (three) times daily as needed for anxiety.   ? Azelastine-Fluticasone 137-50 MCG/ACT SUSP Use 1 spray(s) in each nostril twice daily  ? buPROPion (WELLBUTRIN SR) 150 MG 12 hr tablet Take 150 mg by mouth 2 (two) times daily.  ? cetirizine (ZYRTEC) 10 MG tablet Take 1 tablet (10 mg total) by mouth daily.  ? Fluticasone-Umeclidin-Vilant (TRELEGY ELLIPTA) 100-62.5-25 MCG/INH AEPB Inhale 1 puff into the lungs daily.  ? gabapentin (NEURONTIN) 300 MG capsule Take 300 mg by mouth at bedtime.   ? HYDROcodone bit-homatropine (HYCODAN) 5-1.5 MG/5ML syrup Take 5 mLs by mouth every 6 (six) hours as needed for cough.  ? ibuprofen (ADVIL,MOTRIN) 200 MG tablet Take 400 mg by mouth every 6 (six) hours as needed for headache or moderate pain.  ? ipratropium (ATROVENT) 0.03 % nasal spray USE 2 SPRAY(S) IN EACH NOSTRIL 2 TO 3 TIMES DAILY AS NEEDED FOR RUNNY NOSE  ? ipratropium-albuterol (DUONEB) 0.5-2.5 (3) MG/3ML SOLN  Take 3 mLs by nebulization every 6 (six) hours as needed. (Patient taking differently: Take 3 mLs by nebulization every 6 (six) hours as needed (for shortness of breath or wheezing).)  ? losartan-hydrochlorothiazide (HYZAAR) 50-12.5 MG tablet Take 1 tablet by mouth daily.  ? omeprazole (PRILOSEC) 40 MG capsule TAKE 1 CAPSULE BY MOUTH EVERY DAY  ? Respiratory Therapy Supplies (FLUTTER) DEVI Use as directed.  ? TRELEGY ELLIPTA 100-62.5-25 MCG/INH AEPB INHALE 1 PUFF ONCE DAILY  ?  [DISCONTINUED] montelukast (SINGULAIR) 10 MG tablet Take 1 tablet by mouth once daily  ? ?No facility-administered encounter medications on file as of 02/25/2022.  ? ?Physical Exam: ?Blood pressure 118/70, pulse 77, temperature 98.1 ?F (36.7 ?C), temperature source Oral, height _0  (1.778 m), weight 172 lb 3.2 oz (78.1 kg), SpO2 98 %. ?Gen:      No acute distress ?HEENT:  EOMI, sclera anicteric ?Neck:     No masses; no thyromegaly ?Lungs:    Clear to auscultation bilaterally; normal respiratory effort ?CV:         Regular rate and rhythm; no murmurs ?Abd:      + bowel sounds; soft, non-tender; no palpable masses, no distension ?Ext:    No edema; adequate peripheral perfusion ?Skin:      Warm and dry; no rash ?Neuro: alert and oriented x 3 ?Psych: normal mood and affect  ? ?Data Reviewed: ?Imaging: ?High-resolution CT 03/10/2018-subtle centrilobular groundglass opacities, air trapping in the upper lobes.  Tiny pulmonary nodules the largest is 3 mm. ? ?High-res CT 09/01/2019- resolution of groundglass opacities.  Pulmonary nodules are stable. ? ?High-res CT 04/11/2020-no interstitial lung disease, bronchial wall thickening, small hiatal hernia. ?I reviewed the images personally. ? ?PFTs: ?Spirometry from [place], [place] 03/10/17 ?FVC 2.71 [71%], pre-FEV1 1.53 [54%], post FEV1 1.70 [59%], % change 11 ?F/F 63 ?ABG 04/07/2017-7.4 2/40/73 ?Moderate obstruction with improvement in flow rates especially mid flows post bronchodilator suggestive of small airways disease. ? ?03/03/18 ?FVC 1.70 (41%], FEV1 1.00 (31%], F/F 59, TLC 101, RV/TLC 173%, DLCO 67% ?Severe obstruction with air trapping, moderate diffusion defect  ? ?11/11/18 ?FVC 2.51 [61%], FEV1 1.51 [47%], F/F 60, TLC 100%, DLCO 82% ?Severe obstruction. ? ?04/30/2020 ?FVC 2.28 (56%), FEV1 1.38 (44%), F/F 61, TLC 4.96 [85%], DLCO 24.42 [101%] ?Severe obstruction with air trapping ? ?FENO 11/18/17-13 ?FENO 01/06/18-14 ?FENO 04/04/2018-7 ? ?Labs: ?CBC 11/18/17-WBC 4.7,  eosinophils 1.1%, absolute eos count 100 ?RAST panel 11/18/17-IgE 752, sensitive to multiple allergens including cats, dogs, dust mite, pollen ? ?Alpha-1 antitrypsin 03/03/2018-204, PI FM ?Alpha-1 antitrypsin 03/27/2020-123 ? ?Connective tissue serologies 04/04/2018-ANA, rheumatoid factor, CCP, hypersensitivity panel, double-stranded DNA, Ro, lab, SCL 70- negative ? ?Bronchoscopy 04/15/2018 ?WBC 525, 11% lymphs, 89% monocyte macrophage ?Cultures- Negative ?Cytology-benign.  Path- benign lung and bronchial mucosa. ? ?Sleep  ?Home sleep study 09/15/2019-AHI 3.5, desats to 79% ? ?Assessment:  ?Severe COPD, asthmatic bronchitis ?PFTs show severe obstruction, air trapping.   ?Continue Trelegy inhaler. ?Hycodan for cough ? 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 ?Allergic rhinitis, asthma ?Being followed at the allergy clinic by Dr. [name].   ? ?GERD ?On omeprazole ? ?Abnormal CT scan ?CT scan in 2019 reviewed with mild centrilobular nodules, air trapping.  Bronchoscopy and labs for connective tissue disease, hypersensitivity pneumonitis are negative ?There is no evidence of lymphocytosis or eosinophilia on BAL.  At this point I do not have a  clear evidence of interstitial lung disease.  I suspect that the findings on CT scan may have been nonspecific inflammatory findings since the scan was done as she was recovering from an exacerbation.  Repeat CT shows resolution of these abnormalities. ? ?Lung nodules ?Stable since 2019 and likely benign. Will not require additional follow-up ?She does not meet criteria for screening CT chest ? ?Health maintenance ?05/04/2018-Pneumovax ?Up-to-date with Covid vaccine. Recommended her to get the booster ? ?Plan/Recommendations: ?- Continue Trelegy, duo nebs ? ?[name] MD ?[facility] ?02/25/2022, 4:30  PM ? ?CC: Olena Mater, MD ? ? ?

## 2022-02-25 NOTE — Patient Instructions (Signed)
I am glad you are stable with regard to your breathing ?We will get a chest x-ray today ?Continue Trelegy inhaler ?Follow-up in 6 months with video visit. ?

## 2022-02-26 ENCOUNTER — Ambulatory Visit (INDEPENDENT_AMBULATORY_CARE_PROVIDER_SITE_OTHER): Payer: 59 | Admitting: Gastroenterology

## 2022-03-19 ENCOUNTER — Ambulatory Visit (INDEPENDENT_AMBULATORY_CARE_PROVIDER_SITE_OTHER): Payer: 59 | Admitting: Gastroenterology

## 2022-03-25 ENCOUNTER — Ambulatory Visit: Payer: 59 | Admitting: Pulmonary Disease

## 2022-03-25 ENCOUNTER — Encounter: Payer: Self-pay | Admitting: Pulmonary Disease

## 2022-03-25 NOTE — Telephone Encounter (Signed)
Dr. Vaughan Browner, please advise on pts email. Pt is requesting a refill of her Hycodan syrup.  ?Last refilled: 02/24/22 for #255m, 0 refills ?Last OV: 02/25/22 with Dr. MVaughan Brownerfor COPD and advised to follow up in 6 months.  ? ?

## 2022-03-27 MED ORDER — HYDROCODONE BIT-HOMATROP MBR 5-1.5 MG/5ML PO SOLN: 5.0000 mL | Freq: Four times a day (QID) | ORAL | 0 refills | Status: DC | PRN

## 2022-04-16 ENCOUNTER — Ambulatory Visit (INDEPENDENT_AMBULATORY_CARE_PROVIDER_SITE_OTHER): Payer: 59 | Admitting: Gastroenterology

## 2022-04-27 ENCOUNTER — Encounter: Payer: Self-pay | Admitting: Pulmonary Disease

## 2022-04-27 NOTE — Telephone Encounter (Signed)
Mychart message sent by pt requesting to have a refill of her cough medicine. Pharmacy is [facility]. Dr. Vaughan Browner, please advise.

## 2022-04-28 ENCOUNTER — Encounter: Payer: Self-pay | Admitting: Pulmonary Disease

## 2022-04-29 MED ORDER — HYDROCODONE BIT-HOMATROP MBR 5-1.5 MG/5ML PO SOLN: 5.0000 mL | Freq: Four times a day (QID) | ORAL | 0 refills | Status: DC | PRN

## 2022-04-29 NOTE — Telephone Encounter (Signed)
I sent in the refill for the cough medication

## 2022-05-27 ENCOUNTER — Encounter: Payer: Self-pay | Admitting: Pulmonary Disease

## 2022-05-29 MED ORDER — HYDROCODONE BIT-HOMATROP MBR 5-1.5 MG/5ML PO SOLN: 5.0000 mL | Freq: Four times a day (QID) | ORAL | 0 refills | Status: DC | PRN

## 2022-06-16 ENCOUNTER — Ambulatory Visit (INDEPENDENT_AMBULATORY_CARE_PROVIDER_SITE_OTHER): Payer: 59 | Admitting: Gastroenterology

## 2022-06-25 ENCOUNTER — Encounter: Payer: Self-pay | Admitting: Pulmonary Disease

## 2022-06-26 MED ORDER — HYDROCODONE BIT-HOMATROP MBR 5-1.5 MG/5ML PO SOLN
5.0000 mL | Freq: Four times a day (QID) | ORAL | 0 refills | Status: DC | PRN
Start: 2022-06-26 — End: 2022-07-27

## 2022-07-09 ENCOUNTER — Encounter: Payer: Self-pay | Admitting: *Deleted

## 2022-07-23 ENCOUNTER — Encounter: Payer: Self-pay | Admitting: Pulmonary Disease

## 2022-07-24 NOTE — Telephone Encounter (Signed)
Pt is requesting a refill for Hycodan. Last filled on 06/26/22 for 267m.  Pt last seen in 02/2022 and advised to follow up in 08/2022. Pt does not have a pending appointment has been informed she needs to schedule one.   Dr. MVaughan Browner please advise. Thanks.

## 2022-07-27 MED ORDER — HYDROCODONE BIT-HOMATROP MBR 5-1.5 MG/5ML PO SOLN: 5.0000 mL | Freq: Four times a day (QID) | ORAL | 0 refills | Status: DC | PRN

## 2022-07-30 ENCOUNTER — Other Ambulatory Visit: Payer: Self-pay | Admitting: Pulmonary Disease

## 2022-08-02 NOTE — Progress Notes (Unsigned)
GI Office Note    Referring Provider: Olena Mater, MD Primary Care Physician:  [name], MD  Primary Gastroenterologist: Dr. Gala Romney  Chief Complaint   No chief complaint on file.    History of Present Illness   Theresa Franco is a 61 y.o. female presenting today at the request of Zimmer, William, MD for ***diarrhea and TCS  Per referral paperwork she is due for colonoscopy and has complained of chronic diarrhea since cholecystectomy despite taking Questran. She denied any abdominal pain.   Cholecystectomy in August 2021.    No prior colonoscopy in record.    Today:   Current Outpatient Medications  Medication Sig Dispense Refill   albuterol (PROAIR HFA) 108 (90 Base) MCG/ACT inhaler Inhale 2 puffs into the lungs every 6 (six) hours as needed for wheezing or shortness of breath. 1 Inhaler 1   ALPRAZolam (XANAX) 0.5 MG tablet Take 0.5 mg by mouth 3 (three) times daily as needed for anxiety.      Azelastine-Fluticasone 137-50 MCG/ACT SUSP Use 1 spray(s) in each nostril twice daily 23 g 5   buPROPion (WELLBUTRIN SR) 150 MG 12 hr tablet Take 150 mg by mouth 2 (two) times daily.     cetirizine (ZYRTEC) 10 MG tablet Take 1 tablet (10 mg total) by mouth daily. 30 tablet 5   Fluticasone-Umeclidin-Vilant (TRELEGY ELLIPTA) 100-62.5-25 MCG/INH AEPB Inhale 1 puff into the lungs daily. 1 each 0   gabapentin (NEURONTIN) 300 MG capsule Take 300 mg by mouth at bedtime.      HYDROcodone bit-homatropine (HYCODAN) 5-1.5 MG/5ML syrup Take 5 mLs by mouth every 6 (six) hours as needed for cough. 240 mL 0   ibuprofen (ADVIL,MOTRIN) 200 MG tablet Take 400 mg by mouth every 6 (six) hours as needed for headache or moderate pain.     ipratropium (ATROVENT) 0.03 % nasal spray USE 2 SPRAY(S) IN EACH NOSTRIL 2 TO 3 TIMES DAILY AS NEEDED FOR RUNNY NOSE 30 mL 0   ipratropium-albuterol (DUONEB) 0.5-2.5 (3) MG/3ML SOLN Take 3 mLs by nebulization every 6 (six) hours as needed. (Patient taking  differently: Take 3 mLs by nebulization every 6 (six) hours as needed (for shortness of breath or wheezing).) 360 mL 3   losartan-hydrochlorothiazide (HYZAAR) 50-12.5 MG tablet Take 1 tablet by mouth daily.     omeprazole (PRILOSEC) 40 MG capsule TAKE 1 CAPSULE BY MOUTH EVERY DAY 90 capsule 2   Respiratory Therapy Supplies (FLUTTER) DEVI Use as directed. 1 each 0   TRELEGY ELLIPTA 100-62.5-25 MCG/INH AEPB INHALE 1 PUFF ONCE DAILY 60 each 2   No current facility-administered medications for this visit.    Past Medical History:  Diagnosis Date   Asthma    Hypertension     Past Surgical History:  Procedure Laterality Date   BRONCHOSCOPY  08/2017   TUBAL LIGATION     VIDEO BRONCHOSCOPY Bilateral 04/15/2018   Procedure: VIDEO BRONCHOSCOPY WITH FLUORO;  Surgeon: [name], MD;  Location: [facility];  Service: Cardiopulmonary;  Laterality: Bilateral;    Family History  Problem Relation Age of Onset   Asthma Mother    Hypertension Mother    Asthma Father    COPD Father    Hypertension Father     Allergies as of 08/03/2022 - Review Complete 02/25/2022  Allergen Reaction Noted   Amlodipine Swelling and Other (See Comments) 11/09/2014   Xolair [omalizumab] Other (See Comments) 11/18/2017    Social History   Socioeconomic History   Marital status: Married  Spouse name: Not on file   Number of children: Not on file   Years of education: Not on file   Highest education level: Not on file  Occupational History   Not on file  Tobacco Use   Smoking status: Former    Packs/day: 0.50    Years: 11.00    Total pack years: 5.50    Types: Cigarettes    Start date: 26    Quit date: 11/18/1992    Years since quitting: 29.7   Smokeless tobacco: Never  Vaping Use   Vaping Use: Never used  Substance and Sexual Activity   Alcohol use: Yes    Comment: 3/week   Drug use: No   Sexual activity: Not on file  Other Topics Concern   Not on file  Social History Narrative    Not on file   Social Determinants of Health   Financial Resource Strain: Not on file  Food Insecurity: Not on file  Transportation Needs: Not on file  Physical Activity: Not on file  Stress: Not on file  Social Connections: Not on file  Intimate Partner Violence: Not on file     Review of Systems   Gen: Denies any fever, chills, fatigue, weight loss, lack of appetite.  CV: Denies chest pain, heart palpitations, peripheral edema, syncope.  Resp: Denies shortness of breath at rest or with exertion. Denies wheezing or cough.  GI: see HPI GU : Denies urinary burning, urinary frequency, urinary hesitancy MS: Denies joint pain, muscle weakness, cramps, or limitation of movement.  Derm: Denies rash, itching, dry skin Psych: Denies depression, anxiety, memory loss, and confusion Heme: Denies bruising, bleeding, and enlarged lymph nodes.   Physical Exam   There were no vitals taken for this visit.  General:   Alert and oriented. Pleasant and cooperative. Well-nourished and well-developed.  Head:  Normocephalic and atraumatic. Eyes:  Without icterus, sclera clear and conjunctiva pink.  Ears:  Normal auditory acuity. Mouth:  No deformity or lesions, oral mucosa pink.  Lungs:  Clear to auscultation bilaterally. No wheezes, rales, or rhonchi. No distress.  Heart:  S1, S2 present without murmurs appreciated.  Abdomen:  +BS, soft, non-tender and non-distended. No HSM noted. No guarding or rebound. No masses appreciated.  Rectal:  Deferred  Msk:  Symmetrical without gross deformities. Normal posture. Extremities:  Without edema. Neurologic:  Alert and  oriented x4;  grossly normal neurologically. Skin:  Intact without significant lesions or rashes. Psych:  Alert and cooperative. Normal mood and affect.   Assessment   Theresa Franco is a 61 y.o. female with a history of COPD with asthma, alpha 1 antitrypsin deficiency, cystocele with prolapse, prior cholecystectomy in 2021, and HTN   presenting today with ***  Chronic Diarrhea:     PLAN   *** Proceed with colonoscopy with propofol by Dr. Gala Romney in near future: the risks, benefits, and alternatives have been discussed with the patient in detail. The patient states understanding and desires to proceed. ASA 3 CBC, TSH, stool studies? Try Colestipol?    Venetia Night, MSN, FNP-BC, AGACNP-BC [facility]

## 2022-08-03 ENCOUNTER — Ambulatory Visit (INDEPENDENT_AMBULATORY_CARE_PROVIDER_SITE_OTHER): Payer: Managed Care, Other (non HMO) | Admitting: Gastroenterology

## 2022-08-03 ENCOUNTER — Encounter: Payer: Self-pay | Admitting: *Deleted

## 2022-08-03 ENCOUNTER — Encounter: Payer: Self-pay | Admitting: Gastroenterology

## 2022-08-03 VITALS — BP 130/77 | HR 78 | Temp 97.7°F | Ht 69.0 in | Wt 168.0 lb

## 2022-08-03 DIAGNOSIS — K219 Gastro-esophageal reflux disease without esophagitis: Secondary | ICD-10-CM | POA: Diagnosis not present

## 2022-08-03 DIAGNOSIS — R1310 Dysphagia, unspecified: Secondary | ICD-10-CM | POA: Diagnosis not present

## 2022-08-03 DIAGNOSIS — Z1211 Encounter for screening for malignant neoplasm of colon: Secondary | ICD-10-CM | POA: Diagnosis not present

## 2022-08-03 NOTE — Patient Instructions (Addendum)
We will get you in for a barium pill esophagram to further assess your intermittent issues with swallowing.  Our scheduler will contact you with the date and time .  For any intermittent reflux, if you would like you can stop omeprazole and take famotidine 10 or 20 mg daily.  We will get you scheduled for a colonoscopy with Dr. Gala Romney in the near future.  We will also have you sign a records request to get your prior colonoscopy and pathology report from [facility] in [place].   For your intermittent diarrhea I suspect it is most likely related to dietary triggers given your cholecystectomy.  We can consider restarting Questran if it becomes more bothersome.  Otherwise I would work to avoid higher fat containing meals.  It was a pleasure to meet you today!  I want to create trusting relationships with patients. If you receive a survey regarding your visit,  I greatly appreciate you taking time to fill this out on paper or through your MyChart. I value your feedback.  Venetia Night, MSN, FNP-BC, AGACNP-BC [facility]

## 2022-08-13 ENCOUNTER — Telehealth: Payer: Self-pay | Admitting: *Deleted

## 2022-08-13 MED ORDER — PEG 3350-KCL-NA BICARB-NACL 420 G PO SOLR: 4000.0000 mL | Freq: Once | ORAL | 0 refills | Status: AC

## 2022-08-13 NOTE — Telephone Encounter (Signed)
Called pt. She is scheduled for TCS, ASA 3 with Dr. Gala Romney on 09/30/22 at 1:30pm. Aware will mail instructions/pre-op appt.

## 2022-08-14 ENCOUNTER — Encounter: Payer: Self-pay | Admitting: *Deleted

## 2022-08-17 ENCOUNTER — Ambulatory Visit (HOSPITAL_COMMUNITY)
Admission: RE | Admit: 2022-08-17 | Discharge: 2022-08-17 | Disposition: A | Discharge status: Home / Self Care | Payer: Managed Care, Other (non HMO) | Source: Ambulatory Visit | Attending: Gastroenterology | Admitting: Gastroenterology

## 2022-08-17 DIAGNOSIS — R1310 Dysphagia, unspecified: Secondary | ICD-10-CM | POA: Diagnosis present

## 2022-08-24 ENCOUNTER — Encounter: Payer: Self-pay | Admitting: Pulmonary Disease

## 2022-08-25 MED ORDER — HYDROCODONE BIT-HOMATROP MBR 5-1.5 MG/5ML PO SOLN
5.0000 mL | Freq: Four times a day (QID) | ORAL | 0 refills | Status: DC | PRN
Start: 2022-08-25 — End: 2022-09-22

## 2022-08-25 NOTE — Telephone Encounter (Signed)
Dr. Vaughan Browner, pt is requesting a refill of Hycodan for her COPD.  Last filled: 07/27/22 for 249m, 0 refills.  Last seen: 02/25/2022, advised to follow up in 6 months.  Pt has upcoming video visit on 09/07/22 with Dr. MVaughan Browner

## 2022-08-31 ENCOUNTER — Telehealth: Payer: Self-pay | Admitting: Gastroenterology

## 2022-08-31 ENCOUNTER — Encounter: Payer: Self-pay | Admitting: *Deleted

## 2022-08-31 NOTE — Telephone Encounter (Signed)
Spoke with pt. Scheduled for 10/20 at 12:45 with Dr. Abbey Chatters. She already has prep. Will mail new instructions/pre-op appt.

## 2022-08-31 NOTE — Telephone Encounter (Signed)
Received my chart message from patient regarding her procedures. She queried any chance of getting her procedures done sooner than November.  She is a new patient and originally slotted for Dr. Gala Romney, however given his availability in November, could we look and see if she could have her procedures done with Dr. Abbey Chatters instead.  She has orders for EGD with esophageal dilation and colonoscopy placed at her last office visit.  Venetia Night, MSN, APRN, FNP-BC, AGACNP-BC [facility]

## 2022-09-07 ENCOUNTER — Telehealth (INDEPENDENT_AMBULATORY_CARE_PROVIDER_SITE_OTHER): Payer: 59 | Admitting: Pulmonary Disease

## 2022-09-07 ENCOUNTER — Encounter: Payer: Self-pay | Admitting: Pulmonary Disease

## 2022-09-07 DIAGNOSIS — J4489 Other specified chronic obstructive pulmonary disease: Secondary | ICD-10-CM

## 2022-09-07 NOTE — Progress Notes (Signed)
Theresa Franco    [id number]    [birth year]  Primary Care Physician:Zimmer, [name], MD  Referring Physician: [name], [name] [address]  Virtual Visit via Video Note  I connected with [name] on 09/07/22 at 10:30 AM EDT by a video enabled telemedicine application and verified that I am speaking with the correct person using two identifiers.  Location: Patient: Work  Provider: [place] discussed the limitations of evaluation and management by telemedicine and the availability of in person appointments. The patient expressed understanding and agreed to proceed.   Chief complaint: Follow up for cough, COPD, asthma.  HPI: 61 year old with past medical history of hypertension, asthma, allergies.  She has history of chronic cough for several years.  She is evaluated by Dr. [name], Pulmonologist at [place], [place].  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  She was given Xolair but had to stop after 1 dose as she developed severe hypertension.  She has been tried on breo, trelegy without any improvement in symptoms.  Her peripheral blood count does not show any eosinophilia and she was not a candidate for anti-IL5 therapy.  As per the patient she underwent a bronchoscopy in September 2018 to evaluate for endobronchial lesions which did not show any abnormalities. Medications significant for losartan.  She had been off this for > 1 year with no change in cough.  It had to be restarted after she developed hypertension after Xolair.  She has cough which is mostly nonproductive in nature.  This is more at night and sometimes keeps her up.  She has dyspnea with exertion.  Denies dyspnea at rest, no wheezing, fevers, chills.  Denies any heartburn symptoms.  Followed by Dr. [name], Allergy.  Started on [place] therapy in late 2019 but stopped after a few doses as it did not make a difference and she was afraid of  lowering her immunity and getting Covid CT in April 2019 shows centrilobular nodules for which she underwent bronchoscopy.  Results are nondiagnostic. She had a COPD exacerbation in early 2020 which was treated with antibiotics and steroids.  Developed COVID-19 in January and July 2022.  She did not require hospitalization She also required 2 rounds of prednisone on 2022  Pets: 2 dogs but no birds, cats Occupation: Medical lab tech Exposures: No exposure, no mold at home Smoking history: 10-pack-year history.  Quit in 1993. Travel History: Not significant  Interim history: So far this year she is doing well with no issues.  Breathing is stable Continues on Trelegy inhaler.  She is using Hycodan for cough Has some issues with change in weather but overall is doing ok Has a barium swallow showing marked esophageal dysmotility and stricture.  Plan is to have EGD with possible dilatation in the near future.  Follows with GI clinic in [place]  Outpatient Encounter Medications as of 09/07/2022  Medication Sig   albuterol (PROAIR HFA) 108 (90 Base) MCG/ACT inhaler Inhale 2 puffs into the lungs every 6 (six) hours as needed for wheezing or shortness of breath.   ALPRAZolam (XANAX) 0.5 MG tablet Take 0.5 mg by mouth 3 (three) times daily as needed for anxiety.    Azelastine-Fluticasone 137-50 MCG/ACT SUSP Use 1 spray(s) in each nostril twice daily   buPROPion (WELLBUTRIN SR) 150 MG 12 hr tablet Take 150 mg by mouth 2 (two) times daily.   gabapentin (NEURONTIN)  300 MG capsule Take 300 mg by mouth at bedtime.    HYDROcodone bit-homatropine (HYCODAN) 5-1.5 MG/5ML syrup Take 5 mLs by mouth every 6 (six) hours as needed for cough.   ibuprofen (ADVIL,MOTRIN) 200 MG tablet Take 400 mg by mouth every 6 (six) hours as needed for headache or moderate pain.   ipratropium (ATROVENT) 0.03 % nasal spray USE 2 SPRAY(S) IN EACH NOSTRIL 2 TO 3 TIMES DAILY AS NEEDED FOR RUNNY NOSE   ipratropium-albuterol  (DUONEB) 0.5-2.5 (3) MG/3ML SOLN Take 3 mLs by nebulization every 6 (six) hours as needed. (Patient taking differently: Take 3 mLs by nebulization every 6 (six) hours as needed (for shortness of breath or wheezing).)   losartan-hydrochlorothiazide (HYZAAR) 50-12.5 MG tablet Take 1 tablet by mouth daily.   TRELEGY ELLIPTA 100-62.5-25 MCG/INH AEPB INHALE 1 PUFF ONCE DAILY   [DISCONTINUED] Fluticasone-Umeclidin-Vilant (TRELEGY ELLIPTA) 100-62.5-25 MCG/INH AEPB Inhale 1 puff into the lungs daily.   Respiratory Therapy Supplies (FLUTTER) DEVI Use as directed. (Patient not taking: Reported on 09/07/2022)   No facility-administered encounter medications on file as of 09/07/2022.   Physical Exam: televisit  Data Reviewed: Imaging: High-resolution CT 03/10/2018-subtle centrilobular groundglass opacities, air trapping in the upper lobes.  Tiny pulmonary nodules the largest is 3 mm.  High-res CT 09/01/2019- resolution of groundglass opacities.  Pulmonary nodules are stable.  High-res CT 04/11/2020-no interstitial lung disease, bronchial wall thickening, small hiatal hernia. I reviewed the images personally.  PFTs: Spirometry from [place], [place] 03/10/17 FVC 2.71 [71%], pre-FEV1 1.53 [54%], post FEV1 1.70 [59%], % change 11 F/F 63 ABG 04/07/2017-7.4 2/40/73 Moderate obstruction with improvement in flow rates especially mid flows post bronchodilator suggestive of small airways disease.  03/03/18 FVC 1.70 (41%], FEV1 1.00 (31%], F/F 59, TLC 101, RV/TLC 173%, DLCO 67% Severe obstruction with air trapping, moderate diffusion defect   11/11/18 FVC 2.51 [61%], FEV1 1.51 [47%], F/F 60, TLC 100%, DLCO 82% Severe obstruction.  04/30/2020 FVC 2.28 (56%), FEV1 1.38 (44%), F/F 61, TLC 4.96 [85%], DLCO 24.42 [101%] Severe obstruction with air trapping  FENO 11/18/17-13 FENO 01/06/18-14 FENO 04/04/2018-7  Labs: CBC 11/18/17-WBC 4.7, eosinophils 1.1%, absolute eos count 100 RAST panel 11/18/17-IgE 752,  sensitive to multiple allergens including cats, dogs, dust mite, pollen  Alpha-1 antitrypsin 03/03/2018-204, PI FM Alpha-1 antitrypsin 03/27/2020-123  Connective tissue serologies 04/04/2018-ANA, rheumatoid factor, CCP, hypersensitivity panel, double-stranded DNA, Ro, lab, SCL 70- negative  Bronchoscopy 04/15/2018 WBC 525, 11% lymphs, 89% monocyte macrophage Cultures- Negative Cytology-benign.  Path- benign lung and bronchial mucosa.  Sleep  Home sleep study 09/15/2019-AHI 3.5, desats to 79%  Assessment:  Severe COPD, asthmatic bronchitis PFTs show severe obstruction, air trapping.   Continue Trelegy inhaler. 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 [name].    GERD, esophageal dysmotility, stricture On PI Following with GI for possible dilatation of esophageal stricture  Abnormal CT scan CT scan in 2019 reviewed with mild centrilobular nodules, air trapping.  Bronchoscopy and labs for connective tissue disease, hypersensitivity pneumonitis are negative There is no evidence of lymphocytosis or eosinophilia on BAL.  At this point I do not have a clear evidence of interstitial lung disease.  I suspect that the findings on CT scan may have been nonspecific inflammatory findings since the scan was done as she was recovering from an  exacerbation.  Repeat CT shows resolution of these abnormalities.  Lung nodules Stable since 2019 and likely benign. Will not require additional follow-up She does not meet criteria for screening CT chest  Health maintenance 05/04/2018-Pneumovax Up-to-date with Covid vaccine. Recommended her to get the booster  Plan/Recommendations: - Continue Trelegy, duo nebs  [name] MD [facility] 09/07/2022, 10:37 AM  CC: [name], MD  I discussed the assessment and treatment plan with the patient. The patient was provided an opportunity to ask questions and all were answered. The patient agreed with the plan and demonstrated an understanding of the instructions.   The patient was advised to call back or seek an in-person evaluation if the symptoms worsen or if the condition fails to improve as anticipated.

## 2022-09-07 NOTE — Patient Instructions (Signed)
I am glad you are doing well with regard to your breathing Continue the Trelegy inhaler Follow-up in 6 months with either video visit or in person visit

## 2022-09-10 ENCOUNTER — Telehealth: Payer: Self-pay | Admitting: Internal Medicine

## 2022-09-10 NOTE — Telephone Encounter (Signed)
Pt has questions for the scheduler about her procedure. [phone]

## 2022-09-10 NOTE — Telephone Encounter (Signed)
Called pt. She wanted to change her pre-op appt time. I gave her Carolyns # to call

## 2022-09-21 ENCOUNTER — Encounter: Payer: Self-pay | Admitting: Pulmonary Disease

## 2022-09-21 NOTE — Patient Instructions (Signed)
Theresa Franco  09/21/2022     '@PREFPERIOPPHARMACY'$ @   Your procedure is scheduled on  09/25/2022.   Report to [facility] at  1015  A.M.   Call this number if you have problems the morning of surgery:  [phone]  If you experience any cold or flu symptoms such as cough, fever, chills, shortness of breath, etc. between now and your scheduled surgery, please notify us at the above number.   Remember:  Follow the diet and prep instructions given to you by the office.      Use your nebulizer and your inhaler before you come and bring your rescue inhaler with you.     Take these medicines the morning of surgery with A SIP OF WATER                         xanax(if needed), bupropion, zyrtec.     Do not wear jewelry, make-up or nail polish.  Do not wear lotions, powders, or perfumes, or deodorant.  Do not shave 48 hours prior to surgery.  Men may shave face and neck.  Do not bring valuables to the hospital.  [facility] is not responsible for any belongings or valuables.  Contacts, dentures or bridgework may not be worn into surgery.  Leave your suitcase in the car.  After surgery it may be brought to your room.  For patients admitted to the hospital, discharge time will be determined by your treatment team.  Patients discharged the day of surgery will not be allowed to drive home and must have someone with them for 24 hours.    Special instructions:   DO NOT smoke tobacco or vape for 24 hours before your procedure.  Please read over the following fact sheets that you were given. Anesthesia Post-op Instructions and Care and Recovery After Surgery      Upper Endoscopy, Adult, Care After After the procedure, it is common to have a sore throat. It is also common to have: Mild stomach pain or discomfort. Bloating. Nausea. Follow these instructions at home: The instructions below may help you care for yourself at home. Your health care provider may give you  more instructions. If you have questions, ask your health care provider. If you were given a sedative during the procedure, it can affect you for several hours. Do not drive or operate machinery until your health care provider says that it is safe. If you will be going home right after the procedure, plan to have a responsible adult: Take you home from the hospital or clinic. You will not be allowed to drive. Care for you for the time you are told. Follow instructions from your health care provider about what you may eat and drink. Return to your normal activities as told by your health care provider. Ask your health care provider what activities are safe for you. Take over-the-counter and prescription medicines only as told by your health care provider. Contact a health care provider if you: Have a sore throat that lasts longer than one day. Have trouble swallowing. Have a fever. Get help right away if you: Vomit blood or your vomit looks like coffee grounds. Have bloody, black, or tarry stools. Have a very bad sore throat or you cannot swallow. Have difficulty breathing or very bad pain in your chest or abdomen. These symptoms may be an emergency. Get help right away. Call 911. Do not  wait to see if the symptoms will go away. Do not drive yourself to the hospital. Summary After the procedure, it is common to have a sore throat, mild stomach discomfort, bloating, and nausea. If you were given a sedative during the procedure, it can affect you for several hours. Do not drive until your health care provider says that it is safe. Follow instructions from your health care provider about what you may eat and drink. Return to your normal activities as told by your health care provider. This information is not intended to replace advice given to you by your health care provider. Make sure you discuss any questions you have with your health care provider. Document Revised: 03/04/2022 Document  Reviewed: 03/04/2022 Elsevier Patient Education  [place]. Esophageal Dilatation Esophageal dilatation, also called esophageal dilation, is a procedure to widen or open a blocked or narrowed part of the esophagus. The esophagus is the part of the body that moves food and liquid from the mouth to the stomach. You may need this procedure if: You have a buildup of scar tissue in your esophagus that makes it difficult, painful, or impossible to swallow. This can be caused by gastroesophageal reflux disease (GERD). You have cancer of the esophagus. There is a problem with how food moves through your esophagus. In some cases, you may need this procedure repeated at a later time to dilate the esophagus gradually. Tell a health care provider about: Any allergies you have. All medicines you are taking, including vitamins, herbs, eye drops, creams, and over-the-counter medicines. Any problems you or family members have had with anesthetic medicines. Any blood disorders you have. Any surgeries you have had. Any medical conditions you have. Any antibiotic medicines you are required to take before dental procedures. Whether you are pregnant or may be pregnant. What are the risks? Generally, this is a safe procedure. However, problems may occur, including: Bleeding due to a tear in the lining of the esophagus. A hole, or perforation, in the esophagus. What happens before the procedure? Ask your health care provider about: Changing or stopping your regular medicines. This is especially important if you are taking diabetes medicines or blood thinners. Taking medicines such as aspirin and ibuprofen. These medicines can thin your blood. Do not take these medicines unless your health care provider tells you to take them. Taking over-the-counter medicines, vitamins, herbs, and supplements. Follow instructions from your health care provider about eating or drinking restrictions. Plan to have a  responsible adult take you home from the hospital or clinic. Plan to have a responsible adult care for you for the time you are told after you leave the hospital or clinic. This is important. What happens during the procedure? You may be given a medicine to help you relax (sedative). A numbing medicine may be sprayed into the back of your throat, or you may gargle the medicine. Your health care provider may perform the dilatation using various surgical instruments, such as: Simple dilators. This instrument is carefully placed in the esophagus to stretch it. Guided wire bougies. This involves using an endoscope to insert a wire into the esophagus. A dilator is passed over this wire to enlarge the esophagus. Then the wire is removed. Balloon dilators. An endoscope with a small balloon is inserted into the esophagus. The balloon is inflated to stretch the esophagus and open it up. The procedure may vary among health care providers and hospitals. What can I expect after the procedure? Your blood pressure, heart  rate, breathing rate, and blood oxygen level will be monitored until you leave the hospital or clinic. Your throat may feel slightly sore and numb. This will get better over time. You will not be allowed to eat or drink until your throat is no longer numb. When you are able to drink, urinate, and sit on the edge of the bed without nausea or dizziness, you may be able to return home. Follow these instructions at home: Take over-the-counter and prescription medicines only as told by your health care provider. If you were given a sedative during the procedure, it can affect you for several hours. Do not drive or operate machinery until your health care provider says that it is safe. Plan to have a responsible adult care for you for the time you are told. This is important. Follow instructions from your health care provider about any eating or drinking restrictions. Do not use any products that  contain nicotine or tobacco, such as cigarettes, e-cigarettes, and chewing tobacco. If you need help quitting, ask your health care provider. Keep all follow-up visits. This is important. Contact a health care provider if: You have a fever. You have pain that is not relieved by medicine. Get help right away if: You have chest pain. You have trouble breathing. You have trouble swallowing. You vomit blood. You have black, tarry, or bloody stools. These symptoms may represent a serious problem that is an emergency. Do not wait to see if the symptoms will go away. Get medical help right away. Call your local emergency services (911 in the [place] Do not drive yourself to the hospital. Summary Esophageal dilatation, also called esophageal dilation, is a procedure to widen or open a blocked or narrowed part of the esophagus. Plan to have a responsible adult take you home from the hospital or clinic. For this procedure, a numbing medicine may be sprayed into the back of your throat, or you may gargle the medicine. Do not drive or operate machinery until your health care provider says that it is safe. This information is not intended to replace advice given to you by your health care provider. Make sure you discuss any questions you have with your health care provider. Document Revised: 04/10/2020 Document Reviewed: 04/10/2020 Elsevier Patient Education  [place]. Colonoscopy, Adult, Care After The following information offers guidance on how to care for yourself after your procedure. Your health care provider may also give you more specific instructions. If you have problems or questions, contact your health care provider. What can I expect after the procedure? After the procedure, it is common to have: A small amount of blood in your stool for 24 hours after the procedure. Some gas. Mild cramping or bloating of your abdomen. Follow these instructions at home: Eating and  drinking  Drink enough fluid to keep your urine pale yellow. Follow instructions from your health care provider about eating or drinking restrictions. Resume your normal diet as told by your health care provider. Avoid heavy or fried foods that are hard to digest. Activity Rest as told by your health care provider. Avoid sitting for a long time without moving. Get up to take short walks every 1-2 hours. This is important to improve blood flow and breathing. Ask for help if you feel weak or unsteady. Return to your normal activities as told by your health care provider. Ask your health care provider what activities are safe for you. Managing cramping and bloating  Try walking around when you  have cramps or feel bloated. If directed, apply heat to your abdomen as told by your health care provider. Use the heat source that your health care provider recommends, such as a moist heat pack or a heating pad. Place a towel between your skin and the heat source. Leave the heat on for 20-30 minutes. Remove the heat if your skin turns bright red. This is especially important if you are unable to feel pain, heat, or cold. You have a greater risk of getting burned. General instructions If you were given a sedative during the procedure, it can affect you for several hours. Do not drive or operate machinery until your health care provider says that it is safe. For the first 24 hours after the procedure: Do not sign important documents. Do not drink alcohol. Do your regular daily activities at a slower pace than normal. Eat soft foods that are easy to digest. Take over-the-counter and prescription medicines only as told by your health care provider. Keep all follow-up visits. This is important. Contact a health care provider if: You have blood in your stool 2-3 days after the procedure. Get help right away if: You have more than a small spotting of blood in your stool. You have large blood clots in your  stool. You have swelling of your abdomen. You have nausea or vomiting. You have a fever. You have increasing pain in your abdomen that is not relieved with medicine. These symptoms may be an emergency. Get help right away. Call 911. Do not wait to see if the symptoms will go away. Do not drive yourself to the hospital. Summary After the procedure, it is common to have a small amount of blood in your stool. You may also have mild cramping and bloating of your abdomen. If you were given a sedative during the procedure, it can affect you for several hours. Do not drive or operate machinery until your health care provider says that it is safe. Get help right away if you have a lot of blood in your stool, nausea or vomiting, a fever, or increased pain in your abdomen. This information is not intended to replace advice given to you by your health care provider. Make sure you discuss any questions you have with your health care provider. Document Revised: 07/16/2021 Document Reviewed: 07/16/2021 Elsevier Patient Education  [place] After This sheet gives you information about how to care for yourself after your procedure. Your health care provider may also give you more specific instructions. If you have problems or questions, contact your health care provider. What can I expect after the procedure? After the procedure, it is common to have: Tiredness. Forgetfulness about what happened after the procedure. Impaired judgment for important decisions. Nausea or vomiting. Some difficulty with balance. Follow these instructions at home: For the time period you were told by your health care provider:     Rest as needed. Do not participate in activities where you could fall or become injured. Do not drive or use machinery. Do not drink alcohol. Do not take sleeping pills or medicines that cause drowsiness. Do not make important decisions or sign legal  documents. Do not take care of children on your own. Eating and drinking Follow the diet that is recommended by your health care provider. Drink enough fluid to keep your urine pale yellow. If you vomit: Drink water, juice, or soup when you can drink without vomiting. Make sure you have little or no  nausea before eating solid foods. General instructions Have a responsible adult stay with you for the time you are told. It is important to have someone help care for you until you are awake and alert. Take over-the-counter and prescription medicines only as told by your health care provider. If you have sleep apnea, surgery and certain medicines can increase your risk for breathing problems. Follow instructions from your health care provider about wearing your sleep device: Anytime you are sleeping, including during daytime naps. While taking prescription pain medicines, sleeping medicines, or medicines that make you drowsy. Avoid smoking. Keep all follow-up visits as told by your health care provider. This is important. Contact a health care provider if: You keep feeling nauseous or you keep vomiting. You feel light-headed. You are still sleepy or having trouble with balance after 24 hours. You develop a rash. You have a fever. You have redness or swelling around the IV site. Get help right away if: You have trouble breathing. You have new-onset confusion at home. Summary For several hours after your procedure, you may feel tired. You may also be forgetful and have poor judgment. Have a responsible adult stay with you for the time you are told. It is important to have someone help care for you until you are awake and alert. Rest as told. Do not drive or operate machinery. Do not drink alcohol or take sleeping pills. Get help right away if you have trouble breathing, or if you suddenly become confused. This information is not intended to replace advice given to you by your health care  provider. Make sure you discuss any questions you have with your health care provider. Document Revised: 10/28/2021 Document Reviewed: 10/26/2019 Elsevier Patient Education  [place].

## 2022-09-22 ENCOUNTER — Other Ambulatory Visit: Payer: Self-pay | Admitting: Pulmonary Disease

## 2022-09-22 ENCOUNTER — Encounter (HOSPITAL_COMMUNITY): Payer: Self-pay

## 2022-09-22 ENCOUNTER — Other Ambulatory Visit (HOSPITAL_COMMUNITY): Payer: BC Managed Care – PPO

## 2022-09-22 ENCOUNTER — Encounter (HOSPITAL_COMMUNITY)
Admission: RE | Admit: 2022-09-22 | Discharge: 2022-09-22 | Disposition: A | Discharge status: Home / Self Care | Payer: 59 | Source: Ambulatory Visit | Attending: Internal Medicine | Admitting: Internal Medicine

## 2022-09-22 VITALS — BP 117/64 | HR 76 | Temp 97.9°F | Resp 18 | Ht 69.0 in | Wt 168.0 lb

## 2022-09-22 DIAGNOSIS — Z79899 Other long term (current) drug therapy: Secondary | ICD-10-CM | POA: Diagnosis not present

## 2022-09-22 DIAGNOSIS — I10 Essential (primary) hypertension: Secondary | ICD-10-CM | POA: Diagnosis not present

## 2022-09-22 DIAGNOSIS — Z01818 Encounter for other preprocedural examination: Secondary | ICD-10-CM | POA: Diagnosis present

## 2022-09-22 HISTORY — DX: Chronic cough: R05.3

## 2022-09-22 HISTORY — DX: Anxiety disorder, unspecified: F41.9

## 2022-09-22 HISTORY — DX: Chronic obstructive pulmonary disease, unspecified: J44.9

## 2022-09-22 LAB — BASIC METABOLIC PANEL
Anion gap: 7 (ref 5–15)
BUN: 9 mg/dL (ref 6–20)
CO2: 29 mmol/L (ref 22–32)
Calcium: 9.1 mg/dL (ref 8.9–10.3)
Chloride: 100 mmol/L (ref 98–111)
Creatinine, Ser: 0.96 mg/dL (ref 0.44–1.00)
GFR, Estimated: >60 mL/min (ref >60)
Glucose, Bld: 99 mg/dL (ref 70–99)
Potassium: 3.8 mmol/L (ref 3.5–5.1)
Sodium: 136 mmol/L (ref 135–145)

## 2022-09-22 NOTE — Telephone Encounter (Signed)
Dr. Mannam, please advise on med refill. ?

## 2022-09-23 ENCOUNTER — Encounter: Payer: Self-pay | Admitting: Pulmonary Disease

## 2022-09-23 MED ORDER — HYDROCODONE BIT-HOMATROP MBR 5-1.5 MG/5ML PO SOLN
5.0000 mL | Freq: Four times a day (QID) | ORAL | 0 refills | Status: DC | PRN
Start: 2022-09-23 — End: 2022-10-19

## 2022-09-25 ENCOUNTER — Other Ambulatory Visit: Payer: Self-pay

## 2022-09-25 ENCOUNTER — Ambulatory Visit (HOSPITAL_BASED_OUTPATIENT_CLINIC_OR_DEPARTMENT_OTHER): Payer: 59 | Admitting: Anesthesiology

## 2022-09-25 ENCOUNTER — Ambulatory Visit (HOSPITAL_COMMUNITY): Payer: 59 | Admitting: Anesthesiology

## 2022-09-25 ENCOUNTER — Encounter (HOSPITAL_COMMUNITY)
Admission: RE | Disposition: A | Discharge status: Home / Self Care | Payer: Self-pay | Source: Home / Self Care | Attending: Internal Medicine

## 2022-09-25 ENCOUNTER — Encounter (HOSPITAL_COMMUNITY): Payer: Self-pay

## 2022-09-25 ENCOUNTER — Ambulatory Visit (HOSPITAL_COMMUNITY)
Admission: RE | Admit: 2022-09-25 | Discharge: 2022-09-25 | Disposition: A | Discharge status: Home / Self Care | Payer: 59 | Attending: Internal Medicine | Admitting: Internal Medicine

## 2022-09-25 DIAGNOSIS — I10 Essential (primary) hypertension: Secondary | ICD-10-CM | POA: Insufficient documentation

## 2022-09-25 DIAGNOSIS — Z1212 Encounter for screening for malignant neoplasm of rectum: Secondary | ICD-10-CM | POA: Diagnosis not present

## 2022-09-25 DIAGNOSIS — K219 Gastro-esophageal reflux disease without esophagitis: Secondary | ICD-10-CM | POA: Insufficient documentation

## 2022-09-25 DIAGNOSIS — K222 Esophageal obstruction: Secondary | ICD-10-CM

## 2022-09-25 DIAGNOSIS — Z1211 Encounter for screening for malignant neoplasm of colon: Secondary | ICD-10-CM

## 2022-09-25 DIAGNOSIS — K648 Other hemorrhoids: Secondary | ICD-10-CM | POA: Diagnosis not present

## 2022-09-25 DIAGNOSIS — D123 Benign neoplasm of transverse colon: Secondary | ICD-10-CM | POA: Insufficient documentation

## 2022-09-25 DIAGNOSIS — D120 Benign neoplasm of cecum: Secondary | ICD-10-CM | POA: Insufficient documentation

## 2022-09-25 DIAGNOSIS — K2970 Gastritis, unspecified, without bleeding: Secondary | ICD-10-CM | POA: Insufficient documentation

## 2022-09-25 DIAGNOSIS — K3189 Other diseases of stomach and duodenum: Secondary | ICD-10-CM | POA: Insufficient documentation

## 2022-09-25 DIAGNOSIS — J449 Chronic obstructive pulmonary disease, unspecified: Secondary | ICD-10-CM | POA: Insufficient documentation

## 2022-09-25 DIAGNOSIS — Z87891 Personal history of nicotine dependence: Secondary | ICD-10-CM | POA: Diagnosis not present

## 2022-09-25 DIAGNOSIS — K449 Diaphragmatic hernia without obstruction or gangrene: Secondary | ICD-10-CM | POA: Insufficient documentation

## 2022-09-25 DIAGNOSIS — R1310 Dysphagia, unspecified: Secondary | ICD-10-CM | POA: Insufficient documentation

## 2022-09-25 HISTORY — PX: ESOPHAGOGASTRODUODENOSCOPY (EGD) WITH PROPOFOL: SHX5813

## 2022-09-25 HISTORY — PX: POLYPECTOMY: SHX5525

## 2022-09-25 HISTORY — PX: BALLOON DILATION: SHX5330

## 2022-09-25 HISTORY — PX: COLONOSCOPY WITH PROPOFOL: SHX5780

## 2022-09-25 HISTORY — PX: BIOPSY: SHX5522

## 2022-09-25 SURGERY — COLONOSCOPY WITH PROPOFOL
Anesthesia: General

## 2022-09-25 MED ORDER — PANTOPRAZOLE SODIUM 40 MG PO TBEC: 40.0000 mg | DELAYED_RELEASE_TABLET | Freq: Two times a day (BID) | ORAL | 11 refills | Status: DC

## 2022-09-25 MED ORDER — LACTATED RINGERS IV SOLN: INTRAVENOUS | Status: DC | PRN

## 2022-09-25 MED ORDER — PROPOFOL 500 MG/50ML IV EMUL
INTRAVENOUS | Status: DC | PRN
  Administered 2022-09-25: 150 ug/kg/min via INTRAVENOUS

## 2022-09-25 MED ORDER — PROPOFOL 10 MG/ML IV BOLUS
INTRAVENOUS | Status: DC | PRN
  Administered 2022-09-25: 50 mg via INTRAVENOUS
  Administered 2022-09-25 (×2): 20 mg via INTRAVENOUS
  Administered 2022-09-25: 30 mg via INTRAVENOUS

## 2022-09-25 MED ORDER — LIDOCAINE HCL (CARDIAC) PF 100 MG/5ML IV SOSY
PREFILLED_SYRINGE | INTRAVENOUS | Status: DC | PRN
  Administered 2022-09-25: 100 mg via INTRAVENOUS

## 2022-09-25 NOTE — Anesthesia Preprocedure Evaluation (Signed)
Anesthesia Evaluation  Patient identified by MRN, date of birth, ID band Patient awake    Reviewed: Allergy & Precautions, H&P , NPO status , Patient's Chart, lab work & pertinent test results, reviewed documented beta blocker date and time   Airway Mallampati: II  TM Distance: >3 FB Neck ROM: full    Dental no notable dental hx.    Pulmonary asthma , COPD, former smoker,    Pulmonary exam normal breath sounds clear to auscultation       Cardiovascular Exercise Tolerance: Good hypertension, negative cardio ROS   Rhythm:regular Rate:Normal     Neuro/Psych PSYCHIATRIC DISORDERS Anxiety negative neurological ROS     GI/Hepatic Neg liver ROS, GERD  Medicated,  Endo/Other  negative endocrine ROS  Renal/GU negative Renal ROS  negative genitourinary   Musculoskeletal   Abdominal   Peds  Hematology negative hematology ROS (+)   Anesthesia Other Findings   Reproductive/Obstetrics negative OB ROS                             Anesthesia Physical Anesthesia Plan  ASA: 3  Anesthesia Plan: General   Post-op Pain Management:    Induction:   PONV Risk Score and Plan: Propofol infusion  Airway Management Planned:   Additional Equipment:   Intra-op Plan:   Post-operative Plan:   Informed Consent: I have reviewed the patients History and Physical, chart, labs and discussed the procedure including the risks, benefits and alternatives for the proposed anesthesia with the patient or authorized representative who has indicated his/her understanding and acceptance.     Dental Advisory Given  Plan Discussed with: CRNA  Anesthesia Plan Comments:         Anesthesia Quick Evaluation

## 2022-09-25 NOTE — Op Note (Signed)
[facility] Patient Name: Theresa Franco Procedure Date: 09/25/2022 12:12 PM MRN: [id number] Date of Birth: November 05, 1961 Attending MD: Elon Alas. Abbey Chatters DO CSN: [id number] Age: 61 Admit Type: Outpatient Procedure:                Colonoscopy Indications:              Screening for colorectal malignant neoplasm Providers:                Elon Alas. Abbey Chatters, DO, Janeece Riggers, RN, Raphael Gibney, Technician Referring MD:              Medicines:                See the Anesthesia note for documentation of the                            administered medications Complications:            No immediate complications. Estimated Blood Loss:     Estimated blood loss was minimal. Procedure:                Pre-Anesthesia Assessment:                           - The anesthesia plan was to use monitored                            anesthesia care (MAC).                           After obtaining informed consent, the colonoscope                            was passed under direct vision. Throughout the                            procedure, the patient's blood pressure, pulse, and                            oxygen saturations were monitored continuously. The                            [id number]-HQ190L ([id number]) scope was introduced through                            the anus and advanced to the the cecum, identified                            by appendiceal orifice and ileocecal valve. The                            colonoscopy was performed without difficulty. The                            patient tolerated the procedure well. The  quality                            of the bowel preparation was evaluated using the                            BBPS [facility] Bowel Preparation Scale) with scores                            of: Right Colon = 2 (minor amount of residual                            staining, small fragments of stool and/or opaque                            liquid, but mucosa  seen well), Transverse Colon = 2                            (minor amount of residual staining, small fragments                            of stool and/or opaque liquid, but mucosa seen                            well) and Left Colon = 2 (minor amount of residual                            staining, small fragments of stool and/or opaque                            liquid, but mucosa seen well). The total BBPS score                            equals 6. The quality of the bowel preparation was                            fair. Scope In: 12:39:25 PM Scope Out: 12:53:31 PM Scope Withdrawal Time: 0 hours 11 minutes 57 seconds  Total Procedure Duration: 0 hours 14 minutes 6 seconds  Findings:      The perianal and digital rectal examinations were normal.      Non-bleeding internal hemorrhoids were found during endoscopy.      A 5 mm polyp was found in the cecum. The polyp was sessile. The polyp       was removed with a cold snare. Resection and retrieval were complete.      A 6 mm polyp was found in the transverse colon. The polyp was sessile.       The polyp was removed with a cold snare. Resection and retrieval were       complete.      Biopsies for histology were taken with a cold forceps from the ascending       colon, transverse colon and descending colon for evaluation of       microscopic colitis. Impression:               -  Preparation of the colon was fair.                           - Non-bleeding internal hemorrhoids.                           - One 5 mm polyp in the cecum, removed with a cold                            snare. Resected and retrieved.                           - One 6 mm polyp in the transverse colon, removed                            with a cold snare. Resected and retrieved.                           - Biopsies were taken with a cold forceps from the                            ascending colon, transverse colon and descending                            colon for  evaluation of microscopic colitis. Moderate Sedation:      Per Anesthesia Care Recommendation:           - Patient has a contact number available for                            emergencies. The signs and symptoms of potential                            delayed complications were discussed with the                            patient. Return to normal activities tomorrow.                            Written discharge instructions were provided to the                            patient.                           - Resume previous diet.                           - Continue present medications.                           - Await pathology results.                           - Repeat colonoscopy in 5 years for surveillance.                           -  Return to GI clinic in 3 months. Procedure Code(s):        --- Professional ---                           [phone], Colonoscopy, flexible; with removal of                            tumor(s), polyp(s), or other lesion(s) by snare                            technique                           45380, 67, Colonoscopy, flexible; with biopsy,                            single or multiple Diagnosis Code(s):        --- Professional ---                           Z12.11, Encounter for screening for malignant                            neoplasm of colon                           K64.8, Other hemorrhoids                           K63.5, Polyp of colon CPT copyright 2019 American Medical Association. All rights reserved. The codes documented in this report are preliminary and upon coder review may  be revised to meet current compliance requirements. Elon Alas. Abbey Chatters, DO [place] Abbey Chatters, DO 09/25/2022 12:56:27 PM This report has been signed electronically. Number of Addenda: 0

## 2022-09-25 NOTE — Transfer of Care (Signed)
Immediate Anesthesia Transfer of Care Note  Patient: Theresa Franco  Procedure(s) Performed: COLONOSCOPY WITH PROPOFOL ESOPHAGOGASTRODUODENOSCOPY (EGD) WITH PROPOFOL BALLOON DILATION BIOPSY POLYPECTOMY  Patient Location: PACU  Anesthesia Type:MAC  Level of Consciousness: drowsy  Airway & Oxygen Therapy: Patient Spontanous Breathing and Patient connected to nasal cannula oxygen  Post-op Assessment: Report given to RN and Post -op Vital signs reviewed and stable  Post vital signs: Reviewed and stable  Last Vitals:  Vitals Value Taken Time  BP    Temp    Pulse    Resp    SpO2      Last Pain:  Vitals:   09/25/22 1222  PainSc: 0-No pain         Complications: No notable events documented.

## 2022-09-25 NOTE — H&P (Signed)
Primary Care Physician:  Olena Mater, MD Primary Gastroenterologist:  Dr. Abbey Chatters  Pre-Procedure History & Physical: HPI:  Theresa Franco is a 61 y.o. female is here for an EGD with possible dilation due to chronic GERD/dysphagia and a colonoscopy to be performed for colon cancer screening purposes.  Past Medical History:  Diagnosis Date   Anxiety    Asthma    Chronic cough    COPD (chronic obstructive pulmonary disease) ([place])    Hypertension     Past Surgical History:  Procedure Laterality Date   BRONCHOSCOPY  08/2017   TUBAL LIGATION     VIDEO BRONCHOSCOPY Bilateral 04/15/2018   Procedure: VIDEO BRONCHOSCOPY WITH FLUORO;  Surgeon: [name], MD;  Location: [facility];  Service: Cardiopulmonary;  Laterality: Bilateral;    Prior to Admission medications   Medication Sig Start Date End Date Taking? Authorizing Provider  ALPRAZolam Duanne Moron) 0.5 MG tablet Take 0.5 mg by mouth 3 (three) times daily as needed for anxiety or sleep.   Yes [provider]  buPROPion (WELLBUTRIN SR) 150 MG 12 hr tablet Take 150 mg by mouth 2 (two) times daily.   Yes [provider]  cetirizine (ZYRTEC) 10 MG tablet Take 10 mg by mouth daily.   Yes [provider]  gabapentin (NEURONTIN) 300 MG capsule Take 300 mg by mouth at bedtime.  03/06/20  Yes [provider]  HYDROcodone bit-homatropine (HYCODAN) 5-1.5 MG/5ML syrup Take 5 mLs by mouth every 6 (six) hours as needed for cough. 09/23/22  Yes Mannam, Praveen, MD  ipratropium (ATROVENT) 0.03 % nasal spray USE 2 SPRAY(S) IN EACH NOSTRIL 2 TO 3 TIMES DAILY AS NEEDED FOR RUNNY NOSE 04/11/21  Yes Valentina Shaggy, MD  ipratropium-albuterol (DUONEB) 0.5-2.5 (3) MG/3ML SOLN Take 3 mLs by nebulization every 6 (six) hours as needed. Patient taking differently: Take 3 mLs by nebulization every 6 (six) hours as needed (for shortness of breath or wheezing). 03/03/18  Yes Mannam, Praveen, MD  losartan-hydrochlorothiazide  (HYZAAR) 50-12.5 MG tablet Take 1 tablet by mouth daily. 11/07/20  Yes [provider]  OXYGEN Inhale 2 L into the lungs at bedtime.   Yes [provider]  TRELEGY ELLIPTA 100-62.5-25 MCG/INH AEPB INHALE 1 PUFF ONCE DAILY 11/07/20  Yes Mannam, Praveen, MD  albuterol (PROAIR HFA) 108 (90 Base) MCG/ACT inhaler Inhale 2 puffs into the lungs every 6 (six) hours as needed for wheezing or shortness of breath. 05/04/18   Valentina Shaggy, MD  Azelastine-Fluticasone [phone] MCG/ACT SUSP Use 1 spray(s) in each nostril twice daily Patient not taking: Reported on 09/18/2022 07/30/22   [name], MD  ibuprofen (ADVIL,MOTRIN) 200 MG tablet Take 400 mg by mouth every 6 (six) hours as needed for headache or moderate pain.    [provider]  Respiratory Therapy Supplies (FLUTTER) DEVI Use as directed. Patient not taking: Reported on 09/07/2022 02/10/19   Melvenia Needles, NP    Allergies as of 08/31/2022 - Review Complete 08/03/2022  Allergen Reaction Noted   Amlodipine Swelling and Other (See Comments) 11/09/2014   Xolair [omalizumab] Other (See Comments) 11/18/2017    Family History  Problem Relation Age of Onset   Asthma Mother    Hypertension Mother    Asthma Father    COPD Father    Hypertension Father     Social History   Socioeconomic History   Marital status: Married    Spouse name: Not on file   Number of children: Not on file   Years  of education: Not on file   Highest education level: Not on file  Occupational History   Not on file  Tobacco Use   Smoking status: Former    Packs/day: 0.50    Years: 11.00    Total pack years: 5.50    Types: Cigarettes    Start date: 71    Quit date: 11/18/1992    Years since quitting: 29.8   Smokeless tobacco: Never  Vaping Use   Vaping Use: Never used  Substance and Sexual Activity   Alcohol use: Yes    Comment: 3/week   Drug use: No   Sexual activity: Not on file  Other Topics Concern   Not on file   Social History Narrative   Not on file   Social Determinants of Health   Financial Resource Strain: Not on file  Food Insecurity: Not on file  Transportation Needs: Not on file  Physical Activity: Not on file  Stress: Not on file  Social Connections: Not on file  Intimate Partner Violence: Not on file    Review of Systems: See HPI, otherwise negative ROS  Physical Exam: Vital signs in last 24 hours: Temp:  [97.5 F (36.4 C)] 97.5 F (36.4 C) (10/20 1116) Pulse Rate:  [80] 80 (10/20 1116) Resp:  [22] 22 (10/20 1116) BP: (125)/(77) 125/77 (10/20 1116) SpO2:  [100 %] 100 % (10/20 1116)   General:   Alert,  Well-developed, well-nourished, pleasant and cooperative in NAD Head:  Normocephalic and atraumatic. Eyes:  Sclera clear, no icterus.   Conjunctiva pink. Ears:  Normal auditory acuity. Nose:  No deformity, discharge,  or lesions. Mouth:  No deformity or lesions, dentition normal. Neck:  Supple; no masses or thyromegaly. Lungs:  Clear throughout to auscultation.   No wheezes, crackles, or rhonchi. No acute distress. Heart:  Regular rate and rhythm; no murmurs, clicks, rubs,  or gallops. Abdomen:  Soft, nontender and nondistended. No masses, hepatosplenomegaly or hernias noted. Normal bowel sounds, without guarding, and without rebound.   Msk:  Symmetrical without gross deformities. Normal posture. Extremities:  Without clubbing or edema. Neurologic:  Alert and  oriented x4;  grossly normal neurologically. Skin:  Intact without significant lesions or rashes. Cervical Nodes:  No significant cervical adenopathy. Psych:  Alert and cooperative. Normal mood and affect.  Impression/Plan: Theresa Franco is here for an EGD with possible dilation due to chronic GERD/dysphagia and a colonoscopy to be performed for colon cancer screening purposes.  The risks of the procedure including infection, bleed, or perforation as well as benefits, limitations, alternatives and imponderables  have been reviewed with the patient. Questions have been answered. All parties agreeable.

## 2022-09-25 NOTE — Discharge Instructions (Signed)
EGD Discharge instructions Please read the instructions outlined below and refer to this sheet in the next few weeks. These discharge instructions provide you with general information on caring for yourself after you leave the hospital. Your doctor may also give you specific instructions. While your treatment has been planned according to the most current medical practices available, unavoidable complications occasionally occur. If you have any problems or questions after discharge, please call your doctor. ACTIVITY You may resume your regular activity but move at a slower pace for the next 24 hours.  Take frequent rest periods for the next 24 hours.  Walking will help expel (get rid of) the air and reduce the bloated feeling in your abdomen.  No driving for 24 hours (because of the anesthesia (medicine) used during the test).  You may shower.  Do not sign any important legal documents or operate any machinery for 24 hours (because of the anesthesia used during the test).  NUTRITION Drink plenty of fluids.  You may resume your normal diet.  Begin with a light meal and progress to your normal diet.  Avoid alcoholic beverages for 24 hours or as instructed by your caregiver.  MEDICATIONS You may resume your normal medications unless your caregiver tells you otherwise.  WHAT YOU CAN EXPECT TODAY You may experience abdominal discomfort such as a feeling of fullness or "gas" pains.  FOLLOW-UP Your doctor will discuss the results of your test with you.  SEEK IMMEDIATE MEDICAL ATTENTION IF ANY OF THE FOLLOWING OCCUR: Excessive nausea (feeling sick to your stomach) and/or vomiting.  Severe abdominal pain and distention (swelling).  Trouble swallowing.  Temperature over 101 F (37.8 C).  Rectal bleeding or vomiting of blood.      Colonoscopy Discharge Instructions  Read the instructions outlined below and refer to this sheet in the next few weeks. These discharge instructions provide you  with general information on caring for yourself after you leave the hospital. Your doctor may also give you specific instructions. While your treatment has been planned according to the most current medical practices available, unavoidable complications occasionally occur.   ACTIVITY You may resume your regular activity, but move at a slower pace for the next 24 hours.  Take frequent rest periods for the next 24 hours.  Walking will help get rid of the air and reduce the bloated feeling in your belly (abdomen).  No driving for 24 hours (because of the medicine (anesthesia) used during the test).   Do not sign any important legal documents or operate any machinery for 24 hours (because of the anesthesia used during the test).  NUTRITION Drink plenty of fluids.  You may resume your normal diet as instructed by your doctor.  Begin with a light meal and progress to your normal diet. Heavy or fried foods are harder to digest and may make you feel sick to your stomach (nauseated).  Avoid alcoholic beverages for 24 hours or as instructed.  MEDICATIONS You may resume your normal medications unless your doctor tells you otherwise.  WHAT YOU CAN EXPECT TODAY Some feelings of bloating in the abdomen.  Passage of more gas than usual.  Spotting of blood in your stool or on the toilet paper.  IF YOU HAD POLYPS REMOVED DURING THE COLONOSCOPY: No aspirin products for 7 days or as instructed.  No alcohol for 7 days or as instructed.  Eat a soft diet for the next 24 hours.  FINDING OUT THE RESULTS OF YOUR TEST Not all test results  are available during your visit. If your test results are not back during the visit, make an appointment with your caregiver to find out the results. Do not assume everything is normal if you have not heard from your caregiver or the medical facility. It is important for you to follow up on all of your test results.  SEEK IMMEDIATE MEDICAL ATTENTION IF: You have more than a  spotting of blood in your stool.  Your belly is swollen (abdominal distention).  You are nauseated or vomiting.  You have a temperature over 101.  You have abdominal pain or discomfort that is severe or gets worse throughout the day.   Your upper endoscopy showed moderate amount of inflammation in your stomach.  I took biopsies to rule out infection with a bacteria called H. pylori.  You have a small hiatal hernia.  You also have a tightening of your esophagus called a Schatzki's ring.  I stretched this today.  I then further disrupted it it with cold forceps.  I am going to start you on a new medication called pantoprazole 40 mg twice daily for the next 12 weeks at which point you go down to once daily.  Try to limit NSAID use.  Your colonoscopy revealed 2 polyp(s) which I removed successfully. Await pathology results, my office will contact you. I recommend repeating colonoscopy in 5 years for surveillance purposes.   I also took biopsies of your colon to rule out a condition called microscopic colitis which can cause chronically loose stools especially in women.  Follow-up with GI in 3 months.  I hope you have a great rest of your week!  Elon Alas. Abbey Chatters, D.O. Gastroenterology and Hepatology [facility]

## 2022-09-25 NOTE — Op Note (Signed)
[facility] Patient Name: Theresa Franco Procedure Date: 09/25/2022 12:14 PM MRN: [id number] Date of Birth: [birth year] Attending MD: Elon Alas. Abbey Chatters DO CSN: [id number] Age: 61 Admit Type: Outpatient Procedure:                Upper GI endoscopy Indications:              Dysphagia, Abnormal barium esophagram Providers:                Elon Alas. Abbey Chatters, DO, Janeece Riggers, RN, Raphael Gibney, Technician Referring MD:              Medicines:                See the Anesthesia note for documentation of the                            administered medications Complications:            No immediate complications. Estimated Blood Loss:     Estimated blood loss was minimal. Procedure:                Pre-Anesthesia Assessment:                           - The anesthesia plan was to use monitored                            anesthesia care (MAC).                           After obtaining informed consent, the endoscope was                            passed under direct vision. Throughout the                            procedure, the patient's blood pressure, pulse, and                            oxygen saturations were monitored continuously. The                            [id number]-H190 ([id number]) scope was introduced through the                            mouth, and advanced to the second part of duodenum.                            The upper GI endoscopy was accomplished without                            difficulty. The patient tolerated the procedure                            well. Scope In: 12:25:24  PM Scope Out: 12:35:45 PM Total Procedure Duration: 0 hours 10 minutes 21 seconds  Findings:      A 3 cm hiatal hernia was present.      A moderate Schatzki ring was found in the distal esophagus. A TTS       dilator was passed through the scope. Dilation with an 18-19-20 mm       balloon dilator was performed to 18 mm. The dilation site was examined       and showed  mild mucosal disruption and moderate improvement in luminal       narrowing. Ring was then further disrupted with cold forceps.      Localized moderate inflammation characterized by erosions and erythema       was found in the gastric antrum. Biopsies were taken with a cold forceps       for Helicobacter pylori testing.      The duodenal bulb, first portion of the duodenum and second portion of       the duodenum were normal. Impression:               - 3 cm hiatal hernia.                           - Moderate Schatzki ring. Dilated.                           - Gastritis. Biopsied.                           - Normal duodenal bulb, first portion of the                            duodenum and second portion of the duodenum. Moderate Sedation:      Per Anesthesia Care Recommendation:           - Patient has a contact number available for                            emergencies. The signs and symptoms of potential                            delayed complications were discussed with the                            patient. Return to normal activities tomorrow.                            Written discharge instructions were provided to the                            patient.                           - Resume previous diet.                           - Continue present medications.                           -  Await pathology results.                           - Repeat upper endoscopy PRN for retreatment.                           - Return to GI clinic in 3 months.                           - Use Protonix (pantoprazole) 40 mg PO BID for 12                            weeks. Procedure Code(s):        --- Professional ---                           [phone], Esophagogastroduodenoscopy, flexible,                            transoral; with transendoscopic balloon dilation of                            esophagus (less than 30 mm diameter)                           [id number], 59, Esophagogastroduodenoscopy,  flexible,                            transoral; with biopsy, single or multiple Diagnosis Code(s):        --- Professional ---                           K44.9, Diaphragmatic hernia without obstruction or                            gangrene                           K22.2, Esophageal obstruction                           K29.70, Gastritis, unspecified, without bleeding                           R13.10, Dysphagia, unspecified CPT copyright 2019 American Medical Association. All rights reserved. The codes documented in this report are preliminary and upon coder review may  be revised to meet current compliance requirements. Elon Alas. Abbey Chatters, DO [place] Abbey Chatters, DO 09/25/2022 12:38:03 PM This report has been signed electronically. Number of Addenda: 0

## 2022-09-27 NOTE — Anesthesia Postprocedure Evaluation (Signed)
Anesthesia Post Note  Patient: Theresa Franco  Procedure(s) Performed: COLONOSCOPY WITH PROPOFOL ESOPHAGOGASTRODUODENOSCOPY (EGD) WITH PROPOFOL BALLOON DILATION BIOPSY POLYPECTOMY  Patient location during evaluation: Phase II Anesthesia Type: General Level of consciousness: awake Pain management: pain level controlled Vital Signs Assessment: post-procedure vital signs reviewed and stable Respiratory status: spontaneous breathing and respiratory function stable Cardiovascular status: blood pressure returned to baseline and stable Postop Assessment: no headache and no apparent nausea or vomiting Anesthetic complications: no Comments: Late entry   No notable events documented.   Last Vitals:  Vitals:   09/25/22 1259 09/25/22 1315  BP: (!) 93/53 (P) 113/85  Pulse: 77   Resp: 20   Temp: (!) 36.4 C   SpO2: 98%     Last Pain:  Vitals:   09/25/22 1259  TempSrc: Oral  PainSc: 0-No pain                 Louann Sjogren

## 2022-09-28 ENCOUNTER — Other Ambulatory Visit (HOSPITAL_COMMUNITY): Payer: Managed Care, Other (non HMO)

## 2022-09-29 LAB — SURGICAL PATHOLOGY

## 2022-09-30 ENCOUNTER — Ambulatory Visit (HOSPITAL_COMMUNITY): Admit: 2022-09-30 | Payer: Managed Care, Other (non HMO) | Admitting: Internal Medicine

## 2022-09-30 ENCOUNTER — Encounter (HOSPITAL_COMMUNITY): Payer: Self-pay | Admitting: Internal Medicine

## 2022-09-30 ENCOUNTER — Encounter (HOSPITAL_COMMUNITY): Payer: Self-pay

## 2022-09-30 SURGERY — COLONOSCOPY WITH PROPOFOL
Anesthesia: Monitor Anesthesia Care

## 2022-10-19 ENCOUNTER — Other Ambulatory Visit: Payer: Self-pay | Admitting: Pulmonary Disease

## 2022-10-19 ENCOUNTER — Telehealth: Payer: Self-pay | Admitting: Gastroenterology

## 2022-10-19 NOTE — Telephone Encounter (Signed)
Received prior colonoscopy records from [facility] [place].   EGD and colonoscopy performed 07/29/12: - normal colonoscopy - EGD with small hiatal hernia, normal gastric and duodenal mucosa.  - advised workup with cholecystogram if LUQ pain continued.    Venetia Night, MSN, APRN, FNP-BC, AGACNP-BC [facility]

## 2022-10-19 NOTE — Telephone Encounter (Signed)
Please advise on refill request

## 2022-10-21 ENCOUNTER — Other Ambulatory Visit: Payer: Self-pay | Admitting: Pulmonary Disease

## 2022-10-21 ENCOUNTER — Encounter: Payer: Self-pay | Admitting: Pulmonary Disease

## 2022-10-21 MED ORDER — HYDROCODONE BIT-HOMATROP MBR 5-1.5 MG/5ML PO SOLN: 5.0000 mL | Freq: Four times a day (QID) | ORAL | 0 refills | Status: DC | PRN

## 2022-10-21 NOTE — Telephone Encounter (Signed)
Mychart message sent by pt: Theresa Franco [facility] (supporting Praveen Mannam, MD)1 hour ago (9:10 AM)    Could you please ask Dr. Vaughan Browner to refill my prescription cough medication hycodan? He is currently treating me for stage three COPD with chronic cough. I did a refill request through my chart. Does that work because I didn't receive a response? Thank you for your help.    Dr. Vaughan Browner, please advise on this for pt.

## 2022-10-28 MED ORDER — HYDROCODONE BIT-HOMATROP MBR 5-1.5 MG/5ML PO SOLN: 5.0000 mL | Freq: Four times a day (QID) | ORAL | 0 refills | Status: DC | PRN

## 2022-11-17 ENCOUNTER — Other Ambulatory Visit: Payer: Self-pay | Admitting: Pulmonary Disease

## 2022-11-17 ENCOUNTER — Encounter: Payer: Self-pay | Admitting: Pulmonary Disease

## 2022-11-18 MED ORDER — HYDROCODONE BIT-HOMATROP MBR 5-1.5 MG/5ML PO SOLN: 5.0000 mL | Freq: Four times a day (QID) | ORAL | 0 refills | Status: DC | PRN

## 2022-12-16 ENCOUNTER — Other Ambulatory Visit: Payer: Self-pay | Admitting: Pulmonary Disease

## 2022-12-16 ENCOUNTER — Encounter: Payer: Self-pay | Admitting: Pulmonary Disease

## 2022-12-16 MED ORDER — HYDROCODONE BIT-HOMATROP MBR 5-1.5 MG/5ML PO SOLN: 5.0000 mL | Freq: Four times a day (QID) | ORAL | 0 refills | Status: DC | PRN

## 2022-12-16 NOTE — Telephone Encounter (Signed)
Please advise on med refill. 

## 2022-12-28 NOTE — Progress Notes (Signed)
GI Office Note    Referring Provider: Olena Mater, MD Primary Care Physician:  Olena Mater, MD Primary Gastroenterologist: Elon Alas. Abbey Chatters, DO  Date:  12/29/2022  ID:  Theresa Franco, DOB 09-24-1961, MRN [id number]   Chief Complaint   Chief Complaint  Patient presents with   Follow-up    Follow up on GERD. Not all better   History of Present Illness  Theresa Franco is a 62 y.o. female with a history of COPD with asthma, alpha-1 antitrypsin deficiency, cystocele with prolapse, prior [place] 2021, and HTN presenting today for follow-up of diarrhea and dysphagia post procedures.  EGD and colonoscopy in August 2013.  Colonoscopy normal.  EGD with small hiatal hernia, otherwise normal.  Seen for initial consultation 08/03/2022.  Complaints of dysphagia with solid food.  At times with regurgitation of food.  Also with some acid reflux, using omeprazole occasionally.  Continued to have loose stools.  History of cholecystectomy in August 2021 and had taken Questran in the past.  Denied melena or BRBPR.  On 2 L oxygen at night.  Advised to start famotidine 10 to 20 mg daily as needed for breakthrough symptoms.  Advised to avoid a high fat diet. BPE scheduled.   BPE 08/17/2022: -Marked esophageal dysmotility -Stricture just above GE junction obstructing 12.5 mm barium tablet. -Advised EGD with dilation  EGD 09/25/2022: -3 cm hiatal hernia -Moderate Schatzki's ring s/p dilation -Gastritis s/p biopsy -Normal duodenum -Biopsy with marked reactive gastropathy, negative H. Pylori -PPI twice daily for 12 weeks then decrease to once daily  Colonoscopy 09/25/2022: -Fair progression: -Nonbleeding internal hemorrhoids -5 mm cecal polyp (sessile serrated) -6 mm transverse colon polyp (tubular adenoma) -Random biopsies taken for evaluation of microscopic colitis (negative) -Repeat TCS in 5 years  Feeling of something hung in her throat postendoscopy 11/10/2022.  Reported feeling  irritated after eating, not during eating as before.  Sent in Carafate to try for couple weeks.  Today: Dysphagia: Improved since dilation.   GERD/gastritis: Having bulge in her throat. Medication seems to help but when it happens she feels like she needs to swallow it down. Feels like it is there now in the mornings and then in the middle of the day. Does not feel food contributes to it. Is not painful but is something that is there that she feels. Can feel it when she swallows. Eventually it will get better after an hour or so. Has tried omeprazole in the past but did not take it consistently. Has chronic cough. No N/V.   Diarrhea: Stools are loose but not really firm or watery. Stable and able to manage.    Current Outpatient Medications  Medication Sig Dispense Refill   albuterol (PROAIR HFA) 108 (90 Base) MCG/ACT inhaler Inhale 2 puffs into the lungs every 6 (six) hours as needed for wheezing or shortness of breath. 1 Inhaler 1   ALPRAZolam (XANAX) 0.5 MG tablet Take 0.5 mg by mouth 3 (three) times daily as needed for anxiety or sleep.     buPROPion (WELLBUTRIN SR) 150 MG 12 hr tablet Take 150 mg by mouth 2 (two) times daily.     cetirizine (ZYRTEC) 10 MG tablet Take 10 mg by mouth daily.     gabapentin (NEURONTIN) 300 MG capsule Take 300 mg by mouth at bedtime.      HYDROcodone bit-homatropine (HYCODAN) 5-1.5 MG/5ML syrup Take 5 mLs by mouth every 6 (six) hours as needed for cough. 240 mL 0   ibuprofen (ADVIL,MOTRIN) 200 MG tablet Take  400 mg by mouth every 6 (six) hours as needed for headache or moderate pain.     ipratropium (ATROVENT) 0.03 % nasal spray USE 2 SPRAY(S) IN EACH NOSTRIL 2 TO 3 TIMES DAILY AS NEEDED FOR RUNNY NOSE 30 mL 0   ipratropium-albuterol (DUONEB) 0.5-2.5 (3) MG/3ML SOLN Take 3 mLs by nebulization every 6 (six) hours as needed. (Patient taking differently: Take 3 mLs by nebulization every 6 (six) hours as needed (for shortness of breath or wheezing).) 360 mL 3    losartan-hydrochlorothiazide (HYZAAR) 50-12.5 MG tablet Take 1 tablet by mouth daily.     OXYGEN Inhale 2 L into the lungs at bedtime.     pantoprazole (PROTONIX) 40 MG tablet Take 1 tablet (40 mg total) by mouth 2 (two) times daily. 60 tablet 11   TRELEGY ELLIPTA 100-62.5-25 MCG/INH AEPB INHALE 1 PUFF ONCE DAILY 60 each 2   Azelastine-Fluticasone 137-50 MCG/ACT SUSP Use 1 spray(s) in each nostril twice daily (Patient not taking: Reported on 09/18/2022) 23 g 5   Respiratory Therapy Supplies (FLUTTER) DEVI Use as directed. (Patient not taking: Reported on 09/07/2022) 1 each 0   No current facility-administered medications for this visit.    Past Medical History:  Diagnosis Date   Anxiety    Asthma    Chronic cough    COPD (chronic obstructive pulmonary disease) ([place])    Hypertension     Past Surgical History:  Procedure Laterality Date   BALLOON DILATION N/A 09/25/2022   Procedure: BALLOON DILATION;  Surgeon: Eloise Harman, DO;  [facility];  Service: Endoscopy;  Laterality: N/A;   BIOPSY  09/25/2022   Procedure: BIOPSY;  Surgeon: Eloise Harman, DO;  [facility];  Service: Endoscopy;;   BRONCHOSCOPY  08/2017   COLONOSCOPY WITH PROPOFOL N/A 09/25/2022   Procedure: COLONOSCOPY WITH PROPOFOL;  Surgeon: Eloise Harman, DO;  [facility];  Service: Endoscopy;  Laterality: N/A;  12:45pm, asa 3   ESOPHAGOGASTRODUODENOSCOPY (EGD) WITH PROPOFOL N/A 09/25/2022   Procedure: ESOPHAGOGASTRODUODENOSCOPY (EGD) WITH PROPOFOL;  Surgeon: Eloise Harman, DO;  [facility];  Service: Endoscopy;  Laterality: N/A;   POLYPECTOMY  09/25/2022   Procedure: POLYPECTOMY;  Surgeon: Eloise Harman, DO;  [facility];  Service: Endoscopy;;   TUBAL LIGATION     VIDEO BRONCHOSCOPY Bilateral 04/15/2018   Procedure: VIDEO BRONCHOSCOPY WITH FLUORO;  Surgeon: [name], MD;  Location: [facility];  Service: Cardiopulmonary;  Laterality:  Bilateral;    Family History  Problem Relation Age of Onset   Asthma Mother    Hypertension Mother    Asthma Father    COPD Father    Hypertension Father     Allergies as of 12/29/2022 - Review Complete 12/29/2022  Allergen Reaction Noted   Amlodipine Swelling and Other (See Comments) 11/09/2014   Xolair [omalizumab] Other (See Comments) 11/18/2017    Social History   Socioeconomic History   Marital status: Married    Spouse name: Not on file   Number of children: Not on file   Years of education: Not on file   Highest education level: Not on file  Occupational History   Not on file  Tobacco Use   Smoking status: Former    Packs/day: 0.50    Years: 11.00    Total pack years: 5.50    Types: Cigarettes    Start date: 89    Quit date: 11/18/1992    Years since quitting: 30.1  Smokeless tobacco: Never  Vaping Use   Vaping Use: Never used  Substance and Sexual Activity   Alcohol use: Yes    Comment: 3/week   Drug use: No   Sexual activity: Not on file  Other Topics Concern   Not on file  Social History Narrative   Not on file   Social Determinants of Health   Financial Resource Strain: Not on file  Food Insecurity: Not on file  Transportation Needs: Not on file  Physical Activity: Not on file  Stress: Not on file  Social Connections: Not on file     Review of Systems   Gen: Denies fever, chills, anorexia. Denies fatigue, weakness, weight loss.  CV: Denies chest pain, palpitations, syncope, peripheral edema, and claudication. Resp: Denies dyspnea at rest, cough, wheezing, coughing up blood, and pleurisy. GI: See HPI Derm: Denies rash, itching, dry skin Psych: Denies depression, anxiety, memory loss, confusion. No homicidal or suicidal ideation.  Heme: Denies bruising, bleeding, and enlarged lymph nodes.   Physical Exam   BP 135/84   Pulse 78   Temp (!) 97.2 F (36.2 C)   Ht '5\' 9"'$  (1.753 m)   Wt 168 lb 3.2 oz (76.3 kg)   LMP  (LMP Unknown)  Comment: unknown  BMI 24.84 kg/m   General:   Alert and oriented. No distress noted. Pleasant and cooperative.  Head:  Normocephalic and atraumatic. Eyes:  Conjuctiva clear without scleral icterus. Mouth:  Oral mucosa pink and moist. Good dentition. No lesions. Lungs:  Clear to auscultation bilaterally. No wheezes, rales, or rhonchi. No distress.  Heart:  S1, S2 present without murmurs appreciated.  Abdomen:  +BS, soft, non-tender and non-distended. No rebound or guarding. No HSM or masses noted. Rectal: deferred Msk:  Symmetrical without gross deformities. Normal posture. Extremities:  Without edema. Neurologic:  Alert and  oriented x4 Psych:  Alert and cooperative. Normal mood and affect.   Assessment  Theresa Franco is a 62 y.o. female with a history of COPD with asthma, alpha-1 antitrypsin deficiency, cystocele with prolapse, prior cholecystectomy in 2021, and HTN presenting today for follow-up of diarrhea and dysphagia post procedures.   GERD/gastritis,sensation of lump in throat: EGD in October with gastritis with marked reactive gastropathy, negative for H. pylori.  Has been on pantoprazole 40 mg twice daily for 3 months.  Shortly after EGD she began to have a sensation of a bulge or lump in her throat.  Initially thought it was occurring after eating however now states it is present in the mornings and possibly around lunchtime however can be present before she eats.  Briefly treated with Carafate but continues to have symptoms.  Will trial switching from pantoprazole to Nexium and reduce to once daily and have advised famotidine once a day for breakthrough if she continues to have sensation of lump in her throat.  Advised that she may take Nexium twice daily if needed.  She continues to have ongoing symptoms she is to contact the office and we may consider repeating BP.  Sensation could possibly be secondary to dilation as well as her dysmotility.  Dysphagia: BPN September 2023 with  marked esophageal dysmotility and stricture above GE junction.  EGD in October 2023 with moderate Schatzki's ring s/p dilation.  Resolved post dilation.  Diarrhea: Cholecystectomy in 2021 stable. Not watery.  Previously maintained on cholestyramine, not currently using.  PLAN   Nexium 40 mg once daily. Famotidine 20 mg as needed for breakthrough GERD diet/lifestyle modification. Follow up in  4 months.    Venetia Night, MSN, FNP-BC, AGACNP-BC [facility]

## 2022-12-29 ENCOUNTER — Encounter: Payer: Self-pay | Admitting: Gastroenterology

## 2022-12-29 ENCOUNTER — Ambulatory Visit (INDEPENDENT_AMBULATORY_CARE_PROVIDER_SITE_OTHER): Payer: 59 | Admitting: Gastroenterology

## 2022-12-29 VITALS — BP 135/84 | HR 78 | Temp 97.2°F | Ht 69.0 in | Wt 168.2 lb

## 2022-12-29 DIAGNOSIS — R1310 Dysphagia, unspecified: Secondary | ICD-10-CM | POA: Diagnosis not present

## 2022-12-29 DIAGNOSIS — Z8719 Personal history of other diseases of the digestive system: Secondary | ICD-10-CM | POA: Diagnosis not present

## 2022-12-29 DIAGNOSIS — R09A2 Foreign body sensation, throat: Secondary | ICD-10-CM

## 2022-12-29 DIAGNOSIS — K219 Gastro-esophageal reflux disease without esophagitis: Secondary | ICD-10-CM | POA: Diagnosis not present

## 2022-12-29 DIAGNOSIS — R195 Other fecal abnormalities: Secondary | ICD-10-CM | POA: Diagnosis not present

## 2022-12-29 MED ORDER — ESOMEPRAZOLE MAGNESIUM 40 MG PO CPDR: 40.0000 mg | DELAYED_RELEASE_CAPSULE | Freq: Every day | ORAL | 1 refills | Status: DC

## 2022-12-29 NOTE — Patient Instructions (Addendum)
Please stop taking pantoprazole twice daily.  I have sent in Nexium 40 mg once daily for you.  If there is any issues obtaining the medication regarding your insurance please let me know and I can send an alternative.  Please take your Nexium in the mornings and you may take famotidine 20 mg once a day as needed for any breakthrough at lunchtime or in the evening.   If after few weeks of therapy you continue to have this feeling of a lump in your throat we can consider repeating your barium pill esophagram to assess for any new abnormality.  Will plan to follow-up in 4 months in the office, or sooner if needed.  We also have a virtual option if you would like that.  It was a pleasure to see you today. I want to create trusting relationships with patients. If you receive a survey regarding your visit,  I greatly appreciate you taking time to fill this out on paper or through your MyChart. I value your feedback.  Venetia Night, MSN, FNP-BC, AGACNP-BC [facility]

## 2023-01-07 ENCOUNTER — Other Ambulatory Visit: Payer: Self-pay | Admitting: Pulmonary Disease

## 2023-01-07 ENCOUNTER — Encounter: Payer: Self-pay | Admitting: Pulmonary Disease

## 2023-01-07 NOTE — Telephone Encounter (Signed)
Mychart message received from pt:  [facility] (supporting Praveen Mannam, MD)4 minutes ago (3:30 PM)    Could you please ask Dr. Vaughan Browner to refill my prescription cough medicine hycodan? He is treating me for stage 3 COPD with chronic cough. [facility] is my pharmacy. Thank you.      Dr. Vaughan Browner, please advise.

## 2023-01-08 MED ORDER — HYDROCODONE BIT-HOMATROP MBR 5-1.5 MG/5ML PO SOLN: 5.0000 mL | Freq: Four times a day (QID) | ORAL | 0 refills | Status: DC | PRN

## 2023-01-08 NOTE — Telephone Encounter (Signed)
Dr. Vaughan Browner, It looks like you sent this rx into the pharmacy yesterday.  Could you please refuse this as it seems to be a duplicate. Thank you.

## 2023-02-01 ENCOUNTER — Other Ambulatory Visit: Payer: Self-pay | Admitting: Pulmonary Disease

## 2023-02-01 ENCOUNTER — Encounter: Payer: Self-pay | Admitting: Pulmonary Disease

## 2023-02-01 NOTE — Telephone Encounter (Signed)
Dr. Vaughan Browner please advise on the following My Chart message:   [facility] (supporting [name], MD)6 minutes ago (4:02 PM)    Would you please ask Dr. Vaughan Browner to refill my prescription cough medicine, hycodan? He is currently treating me for stage three COPD with chronic cough. My pharmacy is [facility] , [place], [place]. Thank you.    Thank you

## 2023-02-02 MED ORDER — HYDROCODONE BIT-HOMATROP MBR 5-1.5 MG/5ML PO SOLN: 5.0000 mL | Freq: Four times a day (QID) | ORAL | 0 refills | Status: DC | PRN

## 2023-02-25 ENCOUNTER — Encounter: Payer: Self-pay | Admitting: Pulmonary Disease

## 2023-02-25 NOTE — Telephone Encounter (Signed)
Mychart message sent by pt:  [facility] (supporting [name], MD)2 hours ago (9:13 AM)    Would you please ask Dr. Vaughan Browner to refill my prescription cough medicine, Hycodan? He is currently treating me for stage three COPD with chronic cough.  [facility] is my pharmacy. Thank you.     Dr. Vaughan Browner, please advise.

## 2023-02-26 ENCOUNTER — Other Ambulatory Visit: Payer: Self-pay | Admitting: Pulmonary Disease

## 2023-03-01 ENCOUNTER — Encounter: Payer: Self-pay | Admitting: Pulmonary Disease

## 2023-03-01 NOTE — Telephone Encounter (Signed)
Dr Vaughan Browner please advise on Hycodan refill request.  Last refill 01/08/23 #240.    Message from patient-  Hello. Could you please check on my refill request from Thursday? It's for my hycodan cough medication. Dr. Vaughan Browner is treating me for stage three COPD with chronic cough. [facility] is my pharmacy. Thank you.

## 2023-03-02 NOTE — Telephone Encounter (Signed)
Patient checking on message for refill. Patient phone number is [phone].

## 2023-03-03 ENCOUNTER — Encounter: Payer: Self-pay | Admitting: Pulmonary Disease

## 2023-03-03 MED ORDER — HYDROCODONE BIT-HOMATROP MBR 5-1.5 MG/5ML PO SOLN: 5.0000 mL | Freq: Four times a day (QID) | ORAL | 0 refills | Status: DC | PRN

## 2023-03-23 DIAGNOSIS — F419 Anxiety disorder, unspecified: Secondary | ICD-10-CM | POA: Insufficient documentation

## 2023-03-24 ENCOUNTER — Other Ambulatory Visit: Payer: Self-pay | Admitting: Pulmonary Disease

## 2023-03-24 ENCOUNTER — Encounter: Payer: Self-pay | Admitting: Pulmonary Disease

## 2023-03-24 MED ORDER — HYDROCODONE BIT-HOMATROP MBR 5-1.5 MG/5ML PO SOLN: 5.0000 mL | Freq: Four times a day (QID) | ORAL | 0 refills | Status: DC | PRN

## 2023-03-24 NOTE — Telephone Encounter (Signed)
Dr. Isaiah Serge- patient sent this message was sent to you this afternoon.  Would you please ask Dr. Isaiah Serge to refill my prescription cough medicine, hycodan. He is treating me for stage three COPD with chronic cough. [facility] is the pharmacy. Thank you.    Hycodan 5ml every 6 hours as needed #240 no refills last refilled 03/03/23.  Dr. Isaiah Serge please advise

## 2023-03-31 ENCOUNTER — Telehealth (INDEPENDENT_AMBULATORY_CARE_PROVIDER_SITE_OTHER): Payer: 59 | Admitting: Pulmonary Disease

## 2023-03-31 DIAGNOSIS — J4489 Other specified chronic obstructive pulmonary disease: Secondary | ICD-10-CM | POA: Diagnosis not present

## 2023-03-31 MED ORDER — TRELEGY ELLIPTA 100-62.5-25 MCG/ACT IN AEPB: 1.0000 | INHALATION_SPRAY | Freq: Every day | RESPIRATORY_TRACT | 5 refills | Status: DC

## 2023-03-31 NOTE — Patient Instructions (Signed)
Will make a referral to pulmonary rehab at [facility] for severe COPD Continue Trelegy, cough syrup Return to 6 months with a video visit visit or in person visit

## 2023-03-31 NOTE — Progress Notes (Signed)
Theresa Franco    [id number]    [birth year]  Primary Care Physician:Zimmer, Chrissie Noa, MD  Referring Physician: Christena Flake, MD [address] [place],  [place] [address]  Virtual Visit via Video Note  I connected with Theresa Franco on 09/07/22 at 10:30 AM EDT by a video enabled telemedicine application and verified that I am speaking with the correct person using two identifiers.  Location: Patient: Work  Provider: [address]   I discussed the limitations of evaluation and management by telemedicine and the availability of in person appointments. The patient expressed understanding and agreed to proceed.   Chief complaint: Follow up for cough, COPD, asthma.  HPI: 62 year old with past medical history of hypertension, asthma, allergies.  She has history of chronic cough for several years.  She is evaluated by Dr. Carmon Ginsberg, Pulmonologist at [place], [place].  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  She was given Xolair but had to stop after 1 dose as she developed severe hypertension.  She has been tried on breo, trelegy without any improvement in symptoms.  Her peripheral blood count does not show any eosinophilia and she was not a candidate for anti-IL5 therapy.  As per the patient she underwent a bronchoscopy in September 2018 to evaluate for endobronchial lesions which did not show any abnormalities. Medications significant for losartan.  She had been off this for > 1 year with no change in cough.  It had to be restarted after she developed hypertension after Xolair.  She has cough which is mostly nonproductive in nature.  This is more at night and sometimes keeps her up.  She has dyspnea with exertion.  Denies dyspnea at rest, no wheezing, fevers, chills.  Denies any heartburn symptoms.  Followed by Dr. Dellis Anes, Allergy.  Started on Dupixent therapy in late 2019 but stopped after a few doses as it did not make a difference and she was afraid of  lowering her immunity and getting Covid CT in April 2019 shows centrilobular nodules for which she underwent bronchoscopy.  Results are nondiagnostic. She had a COPD exacerbation in early 2020 which was treated with antibiotics and steroids.  Developed COVID-19 in January and July 2022.  She did not require hospitalization She also required 2 rounds of prednisone on 2022  Pets: 2 dogs but no birds, cats Occupation: Medical lab tech Exposures: No exposure, no mold at home Smoking history: 10-pack-year history.  Quit in 1993. Travel History: Not significant  Interim history: So far this year she is doing well with no issues.  Breathing is stable Continues on Trelegy inhaler.  She is using Hycodan for cough Follows up with GI in [place] for esophageal dysmotility, stricture.  Underwent EGD on 10/23 with dilatation of stricture.  Outpatient Encounter Medications as of 03/31/2023  Medication Sig   albuterol (PROAIR HFA) 108 (90 Base) MCG/ACT inhaler Inhale 2 puffs into the lungs every 6 (six) hours as needed for wheezing or shortness of breath.   ALPRAZolam (XANAX) 0.5 MG tablet Take 0.5 mg by mouth 3 (three) times daily as needed for anxiety or sleep.   buPROPion (WELLBUTRIN SR) 150 MG 12 hr tablet Take 150 mg by mouth 2 (two) times daily.   cetirizine (ZYRTEC) 10 MG tablet Take 10 mg by mouth daily.   esomeprazole (NEXIUM) 40 MG capsule Take 1 capsule (40 mg total) by mouth daily at 12 noon.   Fluticasone-Umeclidin-Vilant (TRELEGY ELLIPTA) 100-62.5-25 MCG/ACT AEPB Inhale  1 puff into the lungs daily.   gabapentin (NEURONTIN) 300 MG capsule Take 300 mg by mouth at bedtime.    HYDROcodone bit-homatropine (HYCODAN) 5-1.5 MG/5ML syrup Take 5 mLs by mouth every 6 (six) hours as needed for cough.   ibuprofen (ADVIL,MOTRIN) 200 MG tablet Take 400 mg by mouth every 6 (six) hours as needed for headache or moderate pain.   ipratropium (ATROVENT) 0.03 % nasal spray USE 2 SPRAY(S) IN EACH NOSTRIL 2 TO  3 TIMES DAILY AS NEEDED FOR RUNNY NOSE   ipratropium-albuterol (DUONEB) 0.5-2.5 (3) MG/3ML SOLN Take 3 mLs by nebulization every 6 (six) hours as needed. (Patient taking differently: Take 3 mLs by nebulization every 6 (six) hours as needed (for shortness of breath or wheezing).)   losartan-hydrochlorothiazide (HYZAAR) 50-12.5 MG tablet Take 1 tablet by mouth daily.   OXYGEN Inhale 2 L into the lungs at bedtime.   [DISCONTINUED] TRELEGY ELLIPTA 100-62.5-25 MCG/INH AEPB INHALE 1 PUFF ONCE DAILY   [DISCONTINUED] HYDROcodone bit-homatropine (HYCODAN) 5-1.5 MG/5ML syrup Take 5 mLs by mouth every 6 (six) hours as needed for cough.   No facility-administered encounter medications on file as of 03/31/2023.   Physical Exam: televisit  Data Reviewed: Imaging: High-resolution CT 03/10/2018-subtle centrilobular groundglass opacities, air trapping in the upper lobes.  Tiny pulmonary nodules the largest is 3 mm.  High-res CT 09/01/2019- resolution of groundglass opacities.  Pulmonary nodules are stable.  High-res CT 04/11/2020-no interstitial lung disease, bronchial wall thickening, small hiatal hernia. I reviewed the images personally.  PFTs: Spirometry from [place], [place] 03/10/17 FVC 2.71 [71%], pre-FEV1 1.53 [54%], post FEV1 1.70 [59%], % change 11 F/F 63 ABG 04/07/2017-7.4 2/40/73 Moderate obstruction with improvement in flow rates especially mid flows post bronchodilator suggestive of small airways disease.  03/03/18 FVC 1.70 (41%], FEV1 1.00 (31%], F/F 59, TLC 101, RV/TLC 173%, DLCO 67% Severe obstruction with air trapping, moderate diffusion defect   11/11/18 FVC 2.51 [61%], FEV1 1.51 [47%], F/F 60, TLC 100%, DLCO 82% Severe obstruction.  04/30/2020 FVC 2.28 (56%), FEV1 1.38 (44%), F/F 61, TLC 4.96 [85%], DLCO 24.42 [101%] Severe obstruction with air trapping  FENO 11/18/17-13 FENO 01/06/18-14 FENO 04/04/2018-7  Labs: CBC 11/18/17-WBC 4.7, eosinophils 1.1%, absolute eos count 100 RAST  panel 11/18/17-IgE 752, sensitive to multiple allergens including cats, dogs, dust mite, pollen  Alpha-1 antitrypsin 03/03/2018-204, PI FM Alpha-1 antitrypsin 03/27/2020-123  Connective tissue serologies 04/04/2018-ANA, rheumatoid factor, CCP, hypersensitivity panel, double-stranded DNA, Ro, lab, SCL 70- negative  Bronchoscopy 04/15/2018 WBC 525, 11% lymphs, 89% monocyte macrophage Cultures- Negative Cytology-benign.  Path- benign lung and bronchial mucosa.  Sleep  Home sleep study 09/15/2019-AHI 3.5, desats to 79%  Assessment:  Severe COPD, asthmatic bronchitis PFTs show severe obstruction, air trapping.   Continue Trelegy inhaler. Hycodan for cough  Referred to pulmonary rehab.  In the past she had refused pulmonary rehab like she is working full-time but is now ready to reconsider as she is slowing down at work.  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 Dellis Anes.    GERD, esophageal dysmotility, stricture On PI Following with GI  Abnormal CT scan CT scan in 2019 reviewed with mild centrilobular nodules, air trapping.  Bronchoscopy and labs for connective tissue disease, hypersensitivity pneumonitis are negative There is no evidence of lymphocytosis or eosinophilia on BAL.  At this  point I do not have a clear evidence of interstitial lung disease.  I suspect that the findings on CT scan may have been nonspecific inflammatory findings since the scan was done as she was recovering from an exacerbation.  Repeat CT shows resolution of these abnormalities.  Lung nodules Stable since 2019 and likely benign. Will not require additional follow-up She does not meet criteria for screening CT chest  Health maintenance 05/04/2018-Pneumovax Up-to-date with Covid  vaccine. Recommended her to get the booster  Plan/Recommendations: - Continue Trelegy, duo nebs - Referral to pulmonary rehab in [place].  Chilton Greathouse MD [facility] 03/31/2023, 4:24 PM  CC: Christena Flake, MD  I discussed the assessment and treatment plan with the patient. The patient was provided an opportunity to ask questions and all were answered. The patient agreed with the plan and demonstrated an understanding of the instructions.   The patient was advised to call back or seek an in-person evaluation if the symptoms worsen or if the condition fails to improve as anticipated.

## 2023-04-05 ENCOUNTER — Encounter: Payer: Self-pay | Admitting: Pulmonary Disease

## 2023-04-12 ENCOUNTER — Encounter: Payer: Self-pay | Admitting: Gastroenterology

## 2023-04-15 ENCOUNTER — Encounter: Payer: Self-pay | Admitting: Pulmonary Disease

## 2023-04-15 NOTE — Telephone Encounter (Signed)
Mychart message sent by pt:  [facility] (supporting Praveen Mannam, MD)5 hours ago (10:40 AM)    Would you please ask Dr. Isaiah Serge to refill my prescription cough medicine hycodan? He is currently treating me for Stage 3 COPD with chronic cough. [facility] in [place] is my pharmacy. Thank you.     Dr. Isaiah Serge, please advise.

## 2023-04-16 MED ORDER — HYDROCODONE BIT-HOMATROP MBR 5-1.5 MG/5ML PO SOLN: 5.0000 mL | Freq: Four times a day (QID) | ORAL | 0 refills | Status: DC | PRN

## 2023-05-10 ENCOUNTER — Encounter: Payer: Self-pay | Admitting: Pulmonary Disease

## 2023-05-10 NOTE — Telephone Encounter (Signed)
Dr. Isaiah Serge- pt asking for refill on hycodan   Last rx 04/16/23 #240 ml   Please advise, thanks!

## 2023-05-11 ENCOUNTER — Encounter: Payer: Self-pay | Admitting: Pulmonary Disease

## 2023-05-11 MED ORDER — HYDROCODONE BIT-HOMATROP MBR 5-1.5 MG/5ML PO SOLN: 5.0000 mL | Freq: Four times a day (QID) | ORAL | 0 refills | Status: DC | PRN

## 2023-06-02 ENCOUNTER — Encounter: Payer: Self-pay | Admitting: Pulmonary Disease

## 2023-06-03 ENCOUNTER — Encounter: Payer: Self-pay | Admitting: Pulmonary Disease

## 2023-06-04 ENCOUNTER — Other Ambulatory Visit: Payer: Self-pay | Admitting: Pulmonary Disease

## 2023-06-04 NOTE — Telephone Encounter (Signed)
Pt calling again for HYDROcodone bit-homatropine (HYCODAN) 5-1.5 MG/5ML syrup   Pharmacy: [facility] in [place]

## 2023-06-07 NOTE — Telephone Encounter (Signed)
Pt. Calling back cn we get the DOD to fill for pt

## 2023-06-08 MED ORDER — HYDROCODONE BIT-HOMATROP MBR 5-1.5 MG/5ML PO SOLN: 5.0000 mL | Freq: Four times a day (QID) | ORAL | 0 refills | Status: DC | PRN

## 2023-06-08 NOTE — Telephone Encounter (Signed)
Sent!

## 2023-06-08 NOTE — Telephone Encounter (Signed)
Good morning,   Pt has requested a refill on  HYDROcodone bit-homatropine (HYCODAN) 5-1.5 MG/5ML syrup   Pharmacy: [facility] in [place]. Please advise

## 2023-06-08 NOTE — Telephone Encounter (Signed)
Called and left detailed msg for the pt letting her know we are sending her refill request to [facility] and this will be filled today.

## 2023-06-08 NOTE — Telephone Encounter (Signed)
Beth, please refill hycodan since Dr. Isaiah Serge not available until 06/14/23  Thanks!

## 2023-06-13 MED ORDER — HYDROCODONE BIT-HOMATROP MBR 5-1.5 MG/5ML PO SOLN: 5.0000 mL | Freq: Four times a day (QID) | ORAL | 0 refills | Status: DC | PRN

## 2023-07-09 ENCOUNTER — Encounter: Payer: Self-pay | Admitting: Pulmonary Disease

## 2023-07-09 ENCOUNTER — Other Ambulatory Visit: Payer: Self-pay | Admitting: Pulmonary Disease

## 2023-07-09 MED ORDER — HYDROCODONE BIT-HOMATROP MBR 5-1.5 MG/5ML PO SOLN: 5.0000 mL | Freq: Four times a day (QID) | ORAL | 0 refills | Status: DC | PRN

## 2023-07-29 ENCOUNTER — Encounter: Payer: Self-pay | Admitting: Pulmonary Disease

## 2023-07-30 MED ORDER — HYDROCODONE BIT-HOMATROP MBR 5-1.5 MG/5ML PO SOLN: 5.0000 mL | Freq: Four times a day (QID) | ORAL | 0 refills | Status: DC | PRN

## 2023-08-07 ENCOUNTER — Other Ambulatory Visit: Payer: Self-pay | Admitting: Pulmonary Disease

## 2023-08-17 ENCOUNTER — Encounter: Payer: Self-pay | Admitting: Pulmonary Disease

## 2023-08-19 MED ORDER — HYDROCODONE BIT-HOMATROP MBR 5-1.5 MG/5ML PO SOLN: 5.0000 mL | Freq: Four times a day (QID) | ORAL | 0 refills | Status: DC | PRN

## 2023-08-23 ENCOUNTER — Encounter: Payer: Self-pay | Admitting: Pulmonary Disease

## 2023-08-23 DIAGNOSIS — G4734 Idiopathic sleep related nonobstructive alveolar hypoventilation: Secondary | ICD-10-CM

## 2023-08-23 NOTE — Telephone Encounter (Signed)
New order for night time O2 has been placed. Please send over office notes and sleep study from 09/15/2019.

## 2023-08-23 NOTE — Telephone Encounter (Signed)
Referral,notes, sleep study faxed to [facility]

## 2023-09-01 ENCOUNTER — Encounter: Payer: Self-pay | Admitting: Pulmonary Disease

## 2023-09-01 DIAGNOSIS — G4734 Idiopathic sleep related nonobstructive alveolar hypoventilation: Secondary | ICD-10-CM

## 2023-09-01 NOTE — Telephone Encounter (Signed)
Order was placed on 9/16.

## 2023-09-02 NOTE — Telephone Encounter (Signed)
PT does not want POC. She only uses her O2 at night only. She has used it over a year and Dr. Isaiah Serge was the one who started her on it so usage start date should be on record.   Please call PT to advise where we go from here. [phone]

## 2023-09-02 NOTE — Telephone Encounter (Signed)
I spoke with Theresa Franco with [facility] she confirmed that they did get the 02 order but our notes only state patient needs 2 liters of 02 at night. I have left a message asking the patient to call me directly to verify how she is using her 02 and when she started using 02. [facility] will need to have a walk test done for her to get POC but if she only requires 02 at night she can't get POC

## 2023-09-03 ENCOUNTER — Telehealth: Payer: Self-pay | Admitting: Pulmonary Disease

## 2023-09-03 NOTE — Telephone Encounter (Signed)
I just spoke with Kyla with [facility]. The order for 02 that was placed has POC on the order. [facility] needs new ONO results within the last 30 days in order to provide the patient her 02 at night only. Would you be willing to place the ONO on Room Air to be faxed to [facility] Fax # [phone] per Francina Ames   I just spoke with Mrs. Manahan and she is aware of what should take place

## 2023-09-03 NOTE — Telephone Encounter (Signed)
I just spoke with Kyla with [facility]. The order for 02 that was placed has POC on the order. [facility] needs new ONO results within the last 30 days in order to provide the patient her 02 at night only. Would you be willing to place the ONO on Room Air to be faxed to [facility] Fax # [phone] per Francina Ames

## 2023-09-07 ENCOUNTER — Other Ambulatory Visit: Payer: Self-pay | Admitting: Pulmonary Disease

## 2023-09-08 ENCOUNTER — Encounter: Payer: Self-pay | Admitting: Pulmonary Disease

## 2023-09-09 MED ORDER — HYDROCODONE BIT-HOMATROP MBR 5-1.5 MG/5ML PO SOLN: 5.0000 mL | Freq: Four times a day (QID) | ORAL | 0 refills | Status: DC | PRN

## 2023-09-10 ENCOUNTER — Telehealth: Payer: Self-pay | Admitting: Pulmonary Disease

## 2023-09-10 NOTE — Addendum Note (Signed)
Addended by: Wyvonne Lenz on: 09/10/2023 02:28 PM   Modules accepted: Orders

## 2023-09-10 NOTE — Telephone Encounter (Signed)
ONO order has been placed.

## 2023-09-10 NOTE — Telephone Encounter (Signed)
PT has to change O2 provider because of Ins changes. She spoke to a nurse here who was going to see about setting up an ONO for her but no one has called her. I see no such order in Activity tab. Please call to advise @ [phone]   Our Synetta Fail spoke to [place] [facility] on 9/25 about this, she said.   [phone]

## 2023-09-13 NOTE — Telephone Encounter (Signed)
Will await Dr. Isaiah Serge response.

## 2023-09-13 NOTE — Telephone Encounter (Signed)
The ONO order has been faxed to [facility]

## 2023-09-13 NOTE — Telephone Encounter (Signed)
ONO ordered 09/10/2023. Nothing further needed.

## 2023-09-14 NOTE — Telephone Encounter (Signed)
I spoke with Theresa Franco in the [facility] dept and she confirmed they has the ONO order

## 2023-09-28 ENCOUNTER — Encounter: Payer: Self-pay | Admitting: Pulmonary Disease

## 2023-09-29 ENCOUNTER — Encounter: Payer: Self-pay | Admitting: Pulmonary Disease

## 2023-09-29 NOTE — Telephone Encounter (Signed)
See patient message from 09/29/23.  Nothing further needed.

## 2023-10-01 ENCOUNTER — Encounter: Payer: Self-pay | Admitting: Pulmonary Disease

## 2023-10-01 NOTE — Telephone Encounter (Signed)
Patient is calling for a refill of her cough medicine. She would also like to know the results of her last pft. Please call and advise. [phone]

## 2023-10-04 ENCOUNTER — Other Ambulatory Visit: Payer: Self-pay

## 2023-10-04 NOTE — Telephone Encounter (Signed)
Pt calling back for refill on her cough medicine & results of PFT

## 2023-10-05 NOTE — Telephone Encounter (Signed)
Patient checking on message for cough syrup and results. Patient phone number is [phone].

## 2023-10-06 ENCOUNTER — Encounter: Payer: Self-pay | Admitting: Pulmonary Disease

## 2023-10-06 NOTE — Telephone Encounter (Signed)
Patient calling again. She has been sending the same message since 10/22 and is getting frustrated. She needs her cough medicine and the results of her of her oximetry. Message sent in Triage chat to no response as well. Please advise.

## 2023-10-06 NOTE — Telephone Encounter (Signed)
Also sending to [facility] for prescription refill for cough

## 2023-10-06 NOTE — Telephone Encounter (Signed)
Dr. Shirlee More pt. Looks like he's available. Will defer to him given it's a controlled substance and I've never seen this patient.  Thanks!

## 2023-10-06 NOTE — Telephone Encounter (Signed)
Pt requesting for cough medication. Dr.Mannam please advise

## 2023-10-07 MED ORDER — HYDROCODONE BIT-HOMATROP MBR 5-1.5 MG/5ML PO SOLN: 5.0000 mL | Freq: Four times a day (QID) | ORAL | 0 refills | Status: DC | PRN

## 2023-10-07 NOTE — Telephone Encounter (Signed)
PT calling upset her cough syrup has not been filled. Pls call to advise @ [phone]  "I have made numerous requests for my refill of prescription cough medication but have not gotten a response. It is very unusual that Dr. Isaiah Serge would not call in my refill. I need this medication to control my chronic cough. 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 "

## 2023-10-11 ENCOUNTER — Encounter: Payer: Self-pay | Admitting: Pulmonary Disease

## 2023-10-19 NOTE — Telephone Encounter (Signed)
Patient checking on results for ono. Patient would like order for oxygen. Patient phone number is [phone].

## 2023-10-19 NOTE — Telephone Encounter (Signed)
Any ONO results received yet? Thanks!

## 2023-10-21 ENCOUNTER — Encounter: Payer: Self-pay | Admitting: Pulmonary Disease

## 2023-10-21 ENCOUNTER — Other Ambulatory Visit: Payer: Self-pay

## 2023-10-21 DIAGNOSIS — G4734 Idiopathic sleep related nonobstructive alveolar hypoventilation: Secondary | ICD-10-CM

## 2023-10-21 NOTE — Progress Notes (Signed)
Overnight oximetry on room air dated 09/28/2023  Duration of study 6 hours 38 minutes Nadir O2 sat of 84%. Time spent less than 88% 3 minutes Patient qualified for oxygen  Spoke with patient and she prefers to continue the oxygen at 2 L as she feels better rested when on the oxygen Will send order to the DME company for 2 L supplemental oxygen at night.  Chilton Greathouse MD [facility] See Amion for pager  If no response to pager , please call [phone] until 7pm After 7:00 pm call Elink  [phone] 10/21/2023, 1:36 PM

## 2023-10-27 ENCOUNTER — Ambulatory Visit: Payer: 59 | Admitting: Pulmonary Disease

## 2023-10-27 ENCOUNTER — Encounter: Payer: Self-pay | Admitting: Pulmonary Disease

## 2023-10-27 VITALS — BP 120/88 | HR 77 | Temp 97.7°F | Ht 69.0 in | Wt 165.8 lb

## 2023-10-27 DIAGNOSIS — J4489 Other specified chronic obstructive pulmonary disease: Secondary | ICD-10-CM | POA: Diagnosis not present

## 2023-10-27 DIAGNOSIS — G4734 Idiopathic sleep related nonobstructive alveolar hypoventilation: Secondary | ICD-10-CM

## 2023-10-27 MED ORDER — TRELEGY ELLIPTA 100-62.5-25 MCG/ACT IN AEPB: 1.0000 | INHALATION_SPRAY | Freq: Every day | RESPIRATORY_TRACT | 11 refills | Status: DC

## 2023-10-27 MED ORDER — ALBUTEROL SULFATE HFA 108 (90 BASE) MCG/ACT IN AERS: 2.0000 | INHALATION_SPRAY | Freq: Four times a day (QID) | RESPIRATORY_TRACT | 1 refills | Status: AC | PRN

## 2023-10-27 NOTE — Progress Notes (Signed)
Theresa Franco    [id number]    Jan 08, 1961  Primary Care Physician:Zimmer, Chrissie Noa, MD  Referring Physician: Christena Flake, MD [address] [place],  [place] [address]   Chief complaint: Follow up for cough, COPD, asthma.  HPI: 62 year old with past medical history of hypertension, asthma, allergies.  She has history of chronic cough for several years.  She is evaluated by Dr. Carmon Ginsberg, Pulmonologist at [place], [place].  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  She was given Xolair but had to stop after 1 dose as she developed severe hypertension.  She has been tried on breo, trelegy without any improvement in symptoms.  Her peripheral blood count does not show any eosinophilia and she was not a candidate for anti-IL5 therapy.  As per the patient she underwent a bronchoscopy in September 2018 to evaluate for endobronchial lesions which did not show any abnormalities. Medications significant for losartan.  She had been off this for > 1 year with no change in cough.  It had to be restarted after she developed hypertension after Xolair.  She has cough which is mostly nonproductive in nature.  This is more at night and sometimes keeps her up.  She has dyspnea with exertion.  Denies dyspnea at rest, no wheezing, fevers, chills.  Denies any heartburn symptoms.  Followed by Dr. Dellis Anes, Allergy.  Started on Dupixent therapy in late 2019 but stopped after a few doses as it did not make a difference and she was afraid of lowering her immunity and getting Covid CT in April 2019 shows centrilobular nodules for which she underwent bronchoscopy.  Results are nondiagnostic. She had a COPD exacerbation in early 2020 which was treated with antibiotics and steroids.  Developed COVID-19 in January and July 2022.  She did not require hospitalization She also required 2 rounds of prednisone on 2022 Follows up with GI in [place] for esophageal dysmotility, stricture.   Underwent EGD on 10/23 with dilatation of stricture.  Pets: 2 dogs but no birds, cats Occupation: Medical lab tech Exposures: No exposure, no mold at home Smoking history: 10-pack-year history.  Quit in 1993. Travel History: Not significant  Interim history: Discussed the use of AI scribe software for clinical note transcription with the patient, who gave verbal consent to proceed.  The patient, with a history of COPD and asthma, reports a significant exacerbation about a month ago after exposure to Lysol spray at work. She describes the sensation as if her lungs were coated, and despite attempts to breathe in clean air, the discomfort persisted for two days. She notes that this incident served as a reminder of her lung disease, which she often forgets about due to generally feeling well.  The patient is currently on an inhaler and cough syrup, which she reports as helpful. She notes that cold weather exacerbates her symptoms. She also has a chronic runny nose, which is managed with two daily nose sprays (Astelazine fluticasone combination nose spray and another unidentified spray). She reports that these sprays make a significant difference in managing her symptoms.  The patient also uses overnight oxygen, which she feels is beneficial. She reports using it during the day only if she experiences a severe episode. She has not had any recent scans, and her last CT scan in 2021 did not show any lung nodules. She quit smoking approximately 35 years ago.   Outpatient Encounter Medications as of 10/27/2023  Medication Sig   albuterol (  PROAIR HFA) 108 (90 Base) MCG/ACT inhaler Inhale 2 puffs into the lungs every 6 (six) hours as needed for wheezing or shortness of breath.   ALPRAZolam (XANAX) 0.5 MG tablet Take 0.5 mg by mouth 3 (three) times daily as needed for anxiety or sleep.   Azelastine-Fluticasone 137-50 MCG/ACT SUSP USE 1 SPRAY IN EACH NOSTRIL  TWICE DAILY   buPROPion (WELLBUTRIN SR) 150 MG  12 hr tablet Take 150 mg by mouth 2 (two) times daily.   cetirizine (ZYRTEC) 10 MG tablet Take 10 mg by mouth daily.   Fluticasone-Umeclidin-Vilant (TRELEGY ELLIPTA) 100-62.5-25 MCG/ACT AEPB Inhale 1 puff into the lungs daily.   gabapentin (NEURONTIN) 300 MG capsule Take 300 mg by mouth at bedtime.    HYDROcodone bit-homatropine (HYCODAN) 5-1.5 MG/5ML syrup Take 5 mLs by mouth every 6 (six) hours as needed for cough.   HYDROcodone bit-homatropine (HYCODAN) 5-1.5 MG/5ML syrup Take 5 mLs by mouth every 6 (six) hours as needed for cough.   HYDROcodone bit-homatropine (HYCODAN) 5-1.5 MG/5ML syrup Take 5 mLs by mouth every 6 (six) hours as needed for cough.   ibuprofen (ADVIL,MOTRIN) 200 MG tablet Take 400 mg by mouth every 6 (six) hours as needed for headache or moderate pain.   ipratropium (ATROVENT) 0.03 % nasal spray USE 2 SPRAY(S) IN EACH NOSTRIL 2 TO 3 TIMES DAILY AS NEEDED FOR RUNNY NOSE   ipratropium-albuterol (DUONEB) 0.5-2.5 (3) MG/3ML SOLN Take 3 mLs by nebulization every 6 (six) hours as needed. (Patient taking differently: Take 3 mLs by nebulization every 6 (six) hours as needed (for shortness of breath or wheezing).)   losartan-hydrochlorothiazide (HYZAAR) 50-12.5 MG tablet Take 1 tablet by mouth daily.   OXYGEN Inhale 2 L into the lungs at bedtime.   pantoprazole (PROTONIX) 40 MG tablet Take 40 mg by mouth 2 (two) times daily.   No facility-administered encounter medications on file as of 10/27/2023.   Physical Exam: Blood pressure 120/88, pulse 77, temperature 97.7 F (36.5 C), temperature source Oral, height 5\' 9"  (1.753 m), weight 165 lb 12.8 oz (75.2 kg), SpO2 98%. Gen:      No acute distress HEENT:  EOMI, sclera anicteric Neck:     No masses; no thyromegaly Lungs:    Clear to auscultation bilaterally; normal respiratory effort CV:         Regular rate and rhythm; no murmurs Abd:      + bowel sounds; soft, non-tender; no palpable masses, no distension Ext:    No edema; adequate  peripheral perfusion Skin:      Warm and dry; no rash Neuro: alert and oriented x 3 Psych: normal mood and affect   Data Reviewed: Imaging: High-resolution CT 03/10/2018-subtle centrilobular groundglass opacities, air trapping in the upper lobes.  Tiny pulmonary nodules the largest is 3 mm.  High-res CT 09/01/2019- resolution of groundglass opacities.  Pulmonary nodules are stable.  High-res CT 04/11/2020-no interstitial lung disease, bronchial wall thickening, small hiatal hernia. I reviewed the images personally.  PFTs: Spirometry from [place], [place] 03/10/17 FVC 2.71 [71%], pre-FEV1 1.53 [54%], post FEV1 1.70 [59%], % change 11 F/F 63 ABG 04/07/2017-7.4 2/40/73 Moderate obstruction with improvement in flow rates especially mid flows post bronchodilator suggestive of small airways disease.  03/03/18 FVC 1.70 (41%], FEV1 1.00 (31%], F/F 59, TLC 101, RV/TLC 173%, DLCO 67% Severe obstruction with air trapping, moderate diffusion defect   11/11/18 FVC 2.51 [61%], FEV1 1.51 [47%], F/F 60, TLC 100%, DLCO 82% Severe obstruction.  04/30/2020 FVC 2.28 (56%), FEV1 1.38 (  44%), F/F 61, TLC 4.96 [85%], DLCO 24.42 [101%] Severe obstruction with air trapping  FENO 11/18/17-13 FENO 01/06/18-14 FENO 04/04/2018-7  Labs: CBC 11/18/17-WBC 4.7, eosinophils 1.1%, absolute eos count 100 RAST panel 11/18/17-IgE 752, sensitive to multiple allergens including cats, dogs, dust mite, pollen  Alpha-1 antitrypsin 03/03/2018-204, PI FM Alpha-1 antitrypsin 03/27/2020-123  Connective tissue serologies 04/04/2018-ANA, rheumatoid factor, CCP, hypersensitivity panel, double-stranded DNA, Ro, lab, SCL 70- negative  Bronchoscopy 04/15/2018 WBC 525, 11% lymphs, 89% monocyte macrophage Cultures- Negative Cytology-benign.  Path- benign lung and bronchial mucosa.  Sleep  Home sleep study 09/15/2019-AHI 3.5, desats to 79%  Assessment:  Severe COPD, asthmatic bronchitis PFTs show severe obstruction, air trapping.    Continue Trelegy inhaler. Hycodan for cough  Referred to pulmonary rehab.  In the past she had refused pulmonary rehab like she is working full-time but is now ready to reconsider as she is slowing down at work.  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 Check levels today  Allergic rhinitis, asthma Being followed at the allergy clinic by Dr. Dellis Anes.    GERD, esophageal dysmotility, stricture On PI Following with GI  Abnormal CT scan CT scan in 2019 reviewed with mild centrilobular nodules, air trapping.  Bronchoscopy and labs for connective tissue disease, hypersensitivity pneumonitis are negative There is no evidence of lymphocytosis or eosinophilia on BAL.  At this point I do not have a clear evidence of interstitial lung disease.  I suspect that the findings on CT scan may have been nonspecific inflammatory findings since the scan was done as she was recovering from an exacerbation.  Repeat CT shows resolution of these abnormalities.  Lung nodules Stable since 2019 and likely benign. Will not require additional follow-up She does not meet criteria for screening CT chest  Health maintenance 05/04/2018-Pneumovax Up-to-date with Covid vaccine. Recommended her to get the booster  Plan/Recommendations: - Continue Trelegy, duo nebs - Referral to pulmonary rehab in [place].  Chilton Greathouse MD [facility] 10/27/2023, 8:38 AM  CC: Christena Flake, MD

## 2023-10-27 NOTE — Addendum Note (Signed)
Addended by: Delrae Rend on: 10/27/2023 08:57 AM   Modules accepted: Orders

## 2023-10-27 NOTE — Patient Instructions (Signed)
VISIT SUMMARY:  During today's visit, we discussed your recent exacerbation of COPD and asthma, likely triggered by exposure to Lysol spray at work. We also reviewed your chronic rhinitis and general health maintenance. Your current treatments are helping manage your symptoms effectively.  YOUR PLAN:  -COPD/ASTHMA: COPD and asthma are chronic lung conditions that cause breathing difficulties. You experienced a recent exacerbation due to environmental exposure. Continue using your inhaler, cough syrup as needed, and oxygen at night and during severe episodes.  -CHRONIC RHINITIS: Chronic rhinitis is persistent inflammation of the nasal passages, causing a runny nose. Continue taking Zyrtec daily and using both nasal sprays (Atrovent and fluticasone/azelastine combination) to manage your symptoms.  -GENERAL HEALTH MAINTENANCE: Your last CT scan in 2021 showed no lung nodules, and you quit smoking 35 years ago. Continue with your current management plan and schedule an annual follow-up. Your cough medicine prescription will be refilled.  INSTRUCTIONS:  Please continue with your current inhaler therapy, cough syrup as needed, and oxygen at night and during exacerbations. Maintain your daily use of Zyrtec and both nasal sprays. Schedule an annual follow-up appointment and ensure your cough medicine prescription is refilled.

## 2023-10-28 ENCOUNTER — Encounter: Payer: Self-pay | Admitting: Pulmonary Disease

## 2023-10-28 NOTE — Telephone Encounter (Signed)
Pt calling about her cough not being called. In. Adv controlled substance may take longer than other meds. See her [facility] message below., TY.  Her # is [phone]

## 2023-10-29 NOTE — Telephone Encounter (Signed)
Patient needs refill of Hycodan.  Pharmacy [facility] in [place]

## 2023-11-01 MED ORDER — HYDROCODONE BIT-HOMATROP MBR 5-1.5 MG/5ML PO SOLN: 5.0000 mL | Freq: Four times a day (QID) | ORAL | 0 refills | Status: DC | PRN

## 2023-11-01 NOTE — Telephone Encounter (Signed)
I was able to call the prescription into the pharmacy and patient was informed. Nothing further needed

## 2023-11-01 NOTE — Telephone Encounter (Signed)
I am just receiving Epic message training today, so I have not previously seen this message.  Lajoyce Lauber is helping me to review charts, and she has seen where the refill has been completed and the patient notified.

## 2023-11-01 NOTE — Telephone Encounter (Signed)
I can print one by my [employer] is not working so this will have to do until IT responds to request for service  - see if pharmacy will accept

## 2023-11-02 ENCOUNTER — Other Ambulatory Visit: Payer: Self-pay | Admitting: Pulmonary Disease

## 2023-11-03 LAB — ALPHA-1-ANTITRYPSIN: A-1 Antitrypsin, Ser: 111 mg/dL (ref 83–199)

## 2023-11-03 LAB — HOUSE ACCOUNT TRACKING

## 2023-11-15 ENCOUNTER — Other Ambulatory Visit: Payer: Self-pay | Admitting: Internal Medicine

## 2023-11-19 ENCOUNTER — Encounter: Payer: Self-pay | Admitting: Pulmonary Disease

## 2023-11-20 NOTE — Telephone Encounter (Signed)
Dr. Isaiah Serge, please see mychart message sent by pt about refill request.

## 2023-11-22 MED ORDER — HYDROCODONE BIT-HOMATROP MBR 5-1.5 MG/5ML PO SOLN: 5.0000 mL | Freq: Four times a day (QID) | ORAL | 0 refills | Status: DC | PRN

## 2023-12-10 ENCOUNTER — Encounter: Payer: Self-pay | Admitting: Pulmonary Disease

## 2023-12-13 ENCOUNTER — Encounter: Payer: Self-pay | Admitting: Pulmonary Disease

## 2023-12-14 MED ORDER — HYDROCODONE BIT-HOMATROP MBR 5-1.5 MG/5ML PO SOLN: 5.0000 mL | Freq: Four times a day (QID) | ORAL | 0 refills | Status: DC | PRN

## 2023-12-14 NOTE — Telephone Encounter (Signed)
 PT now calling for refill. Pharm is [facility].

## 2023-12-18 NOTE — Telephone Encounter (Signed)
 Please see mychart sent by pt and advise.

## 2023-12-21 MED ORDER — HYDROCODONE BIT-HOMATROP MBR 5-1.5 MG/5ML PO SOLN: 5.0000 mL | Freq: Four times a day (QID) | ORAL | 0 refills | Status: DC | PRN

## 2023-12-30 ENCOUNTER — Telehealth: Payer: Self-pay | Admitting: Pulmonary Disease

## 2023-12-30 NOTE — Telephone Encounter (Signed)
Patient would like refill for cough syrup. Pharmacy is [facility] [place]. Patient phone number is [phone].

## 2023-12-31 NOTE — Telephone Encounter (Signed)
I called and spoke with the pt and notified of response from Dr Isaiah Serge  She verbalized understanding  Nothing further needed

## 2023-12-31 NOTE — Telephone Encounter (Signed)
I sent a prescription on 12/21/23 for the cough medication. So we are not due for a refill till next month

## 2024-01-13 ENCOUNTER — Encounter: Payer: Self-pay | Admitting: Pulmonary Disease

## 2024-01-14 ENCOUNTER — Telehealth: Payer: Self-pay | Admitting: Pulmonary Disease

## 2024-01-14 MED ORDER — HYDROCODONE BIT-HOMATROP MBR 5-1.5 MG/5ML PO SOLN: 5.0000 mL | Freq: Four times a day (QID) | ORAL | 0 refills | Status: DC | PRN

## 2024-01-14 NOTE — Telephone Encounter (Signed)
 Patient needs refill of Hycodin   Pharmacy: [facility]

## 2024-01-14 NOTE — Telephone Encounter (Signed)
 Lm for patient.  Dr. Waylan Haggard, please advise on Hycodan refill. Last refilled 12/21/2023 #240.

## 2024-01-14 NOTE — Telephone Encounter (Signed)
 Dr. Isaiah Serge, please see mychart sent by pt and advise.

## 2024-01-31 ENCOUNTER — Encounter: Payer: Self-pay | Admitting: Pulmonary Disease

## 2024-02-02 ENCOUNTER — Telehealth: Payer: Self-pay | Admitting: Pulmonary Disease

## 2024-02-02 NOTE — Telephone Encounter (Signed)
 Pt needs refill. She has also sent the following MYCHART Msg:  Could you please ask Dr. Isaiah Serge to refill my prescription cough medicine Hycodan? He is currently treating me for stage 3 COPD with chronic cough. [facility]. Thank you.   Her # is [phone]

## 2024-02-03 NOTE — Telephone Encounter (Signed)
 Dr Isaiah Serge,   Pt sent the following message through Mychart. Please advise.    Could you please ask Dr. Isaiah Serge to refill my prescription cough medicine Hycodan? He is currently treating me for stage 3 COPD with chronic cough. [facility]. Thank you.

## 2024-02-03 NOTE — Telephone Encounter (Signed)
 Patient is calling for an update on her refill

## 2024-02-04 ENCOUNTER — Encounter: Payer: Self-pay | Admitting: *Deleted

## 2024-02-04 MED ORDER — HYDROCODONE BIT-HOMATROP MBR 5-1.5 MG/5ML PO SOLN
5.0000 mL | Freq: Four times a day (QID) | ORAL | 0 refills | Status: DC | PRN
Start: 2024-02-04 — End: 2024-02-25

## 2024-02-04 NOTE — Telephone Encounter (Signed)
 I sent the pt mychart msg to let her know I am routing msg to provider again to ask about this refill   Will up to marking urgent since this is her second request

## 2024-02-17 NOTE — Telephone Encounter (Signed)
 nfn

## 2024-02-24 ENCOUNTER — Telehealth: Payer: Self-pay | Admitting: Pulmonary Disease

## 2024-02-24 NOTE — Telephone Encounter (Signed)
 Could you please ask Dr. Isaiah Serge to refill prescription cough medicine Hycodan? He is currently treating her for stage 3 COPD with chronic cough.   [facility] is the [facility].   Her # is [phone]

## 2024-02-24 NOTE — Telephone Encounter (Signed)
 Called and spoke to patient.  She is requesting refill on Hycodan for chronic cough. Last refilled 02/04/24 #240  Dr. Isaiah Serge, please advise. Thanks

## 2024-02-25 MED ORDER — HYDROCODONE BIT-HOMATROP MBR 5-1.5 MG/5ML PO SOLN: 5.0000 mL | Freq: Four times a day (QID) | ORAL | 0 refills | Status: DC | PRN

## 2024-02-25 NOTE — Telephone Encounter (Signed)
 Hycodan has been refilled.  Patient informed Nothing further needed

## 2024-03-16 ENCOUNTER — Other Ambulatory Visit: Payer: Self-pay | Admitting: Pulmonary Disease

## 2024-03-16 NOTE — Telephone Encounter (Signed)
 Copied from CRM [id number]. Topic: Clinical - Medication Refill >> Mar 16, 2024  8:40 AM Konrad Dolores wrote: Most Recent Primary Care Visit:  Provider: Lynnea Ferrier T  [facility]  Visit Type: OFFICE VISIT  Date: 06/16/2021  Medication: HYDROcodone bit-homatropine (HYCODAN) 5-1.5 MG/5ML syrup   Has the patient contacted their pharmacy? No; but patient stated there is no refills listed on the medication. (Agent: If no, request that the patient contact the pharmacy for the refill. If patient does not wish to contact the pharmacy document the reason why and proceed with request.) (Agent: If yes, when and what did the pharmacy advise?)  Is this the correct pharmacy for this prescription? Yes If no, delete pharmacy and type the correct one.  This is the patient's preferred pharmacy:  [facility] [address] [place] - [address] [place] [address] Phone: [phone] Fax: [phone]   Has the prescription been filled recently? Yes  Is the patient out of the medication? Yes  Has the patient been seen for an appointment in the last year OR does the patient have an upcoming appointment? Yes  Can we respond through MyChart? Yes  Agent: Please be advised that Rx refills may take up to 3 business days. We ask that you follow-up with your pharmacy.

## 2024-03-16 NOTE — Telephone Encounter (Signed)
 Last Fill: 02/25/24 240 mL/0 RF  Last OV: 10/27/23 Next OV: None Scheduled  Routing to provider for review/authorization.

## 2024-03-16 NOTE — Telephone Encounter (Signed)
**Note De-identified  [place] Obfuscation** Please advise 

## 2024-03-17 MED ORDER — HYDROCODONE BIT-HOMATROP MBR 5-1.5 MG/5ML PO SOLN: 5.0000 mL | Freq: Four times a day (QID) | ORAL | 0 refills | Status: DC | PRN

## 2024-04-06 ENCOUNTER — Other Ambulatory Visit: Payer: Self-pay | Admitting: Pulmonary Disease

## 2024-04-06 NOTE — Telephone Encounter (Signed)
 Copied from CRM [id number]. Topic: Clinical - Medication Refill >> Apr 06, 2024 12:01 PM Ambrose Junk wrote: Most Recent Primary Care Visit:  Provider: Eliane Grooms T  [facility]  Visit Type: OFFICE VISIT  Date: 06/16/2021  Medication: HYDROcodone  bit-homatropine (HYCODAN) 5-1.5 MG/5ML syrup  Has the patient contacted their pharmacy? Yes (Agent: If no, request that the patient contact the pharmacy for the refill. If patient does not wish to contact the pharmacy document the reason why and proceed with request.) (Agent: If yes, when and what did the pharmacy advise?)  Is this the correct pharmacy for this prescription? Yes If no, delete pharmacy and type the correct one.  This is the patient's preferred pharmacy:   [facility] [address], [place] - [address] [place] [address] Phone: [phone] Fax: [phone]  Has the prescription been filled recently? No  Is the patient out of the medication? Yes  Has the patient been seen for an appointment in the last year OR does the patient have an upcoming appointment? Yes  Can we respond through MyChart? Yes  Agent: Please be advised that Rx refills may take up to 3 business days. We ask that you follow-up with your pharmacy.

## 2024-04-06 NOTE — Telephone Encounter (Signed)
 Last Fill: 03/17/24  Last OV: 10/27/23 Next OV: None Scheduled   Routing to provider for review/authorization.

## 2024-04-06 NOTE — Telephone Encounter (Signed)
**Note De-identified  [place] Obfuscation** Please advise 

## 2024-04-10 ENCOUNTER — Telehealth: Payer: Self-pay

## 2024-04-10 ENCOUNTER — Telehealth (HOSPITAL_BASED_OUTPATIENT_CLINIC_OR_DEPARTMENT_OTHER): Payer: Self-pay

## 2024-04-10 ENCOUNTER — Other Ambulatory Visit: Payer: Self-pay | Admitting: Pulmonary Disease

## 2024-04-10 MED ORDER — HYDROCODONE BIT-HOMATROP MBR 5-1.5 MG/5ML PO SOLN: 5.0000 mL | Freq: Four times a day (QID) | ORAL | 0 refills | Status: DC | PRN

## 2024-04-10 NOTE — Telephone Encounter (Signed)
Pt is aware. Nothing further needed 

## 2024-04-10 NOTE — Telephone Encounter (Signed)
 Prescription has been sent to the pharmacy.

## 2024-04-10 NOTE — Telephone Encounter (Signed)
 Copied from CRM [id number]. Topic: Clinical - Medication Refill >> Apr 10, 2024  9:11 AM Crist Dominion wrote: Most Recent Primary Care Visit:  Provider: Eliane Grooms T  [facility]  Visit Type: OFFICE VISIT  Date: 06/16/2021  Medication: HYDROcodone  bit-homatropine (HYCODAN) 5-1.5 MG/5ML syrup  Has the patient contacted their pharmacy? Yes, out of refills.  (Agent: If no, request that the patient contact the pharmacy for the refill. If patient does not wish to contact the pharmacy document the reason why and proceed with request.) (Agent: If yes, when and what did the pharmacy advise?)  Is this the correct pharmacy for this prescription? Yes If no, delete pharmacy and type the correct one.  This is the patient's preferred pharmacy:  [facility] [address], [place] - [address] [place] [address] Phone: [phone] Fax: [phone]    Has the prescription been filled recently? Yes  Is the patient out of the medication? Yes, and has been for over 3 days  Has the patient been seen for an appointment in the last year OR does the patient have an upcoming appointment? Yes  Can we respond through MyChart? Yes  Agent: Please be advised that Rx refills may take up to 3 business days. We ask that you follow-up with your pharmacy.

## 2024-04-10 NOTE — Telephone Encounter (Signed)
 Dr Waylan Haggard, Please advise on controlled substance refill.

## 2024-04-10 NOTE — Telephone Encounter (Signed)
 Copied from CRM [id number]. Topic: Clinical - Medication Refill >> Apr 06, 2024 12:01 PM Theresa Franco wrote: Most Recent Primary Care Visit:  Provider: Eliane Grooms T  [facility]  Visit Type: OFFICE VISIT  Date: 06/16/2021  Medication: HYDROcodone  bit-homatropine (HYCODAN) 5-1.5 MG/5ML syrup  Has the patient contacted their pharmacy? Yes (Agent: If no, request that the patient contact the pharmacy for the refill. If patient does not wish to contact the pharmacy document the reason why and proceed with request.) (Agent: If yes, when and what did the pharmacy advise?)  Is this the correct pharmacy for this prescription? Yes If no, delete pharmacy and type the correct one.  This is the patient's preferred pharmacy:   [facility] [address], [place] - [address] [place] [address] Phone: [phone] Fax: [phone]  Has the prescription been filled recently? No  Is the patient out of the medication? Yes  Has the patient been seen for an appointment in the last year OR does the patient have an upcoming appointment? Yes  Can we respond through MyChart? Yes  Agent: Please be advised that Rx refills may take up to 3 business days. We ask that you follow-up with your pharmacy.    Dr. Waylan Haggard please advise if OK to send refill for Hycodan syrup

## 2024-04-10 NOTE — Telephone Encounter (Signed)
 Please advise  Please advise:Copied from CRM [id number]. Topic: Clinical - Medication Refill >> Apr 06, 2024 12:01 PM Ambrose Junk wrote: Most Recent Primary Care Visit:  Provider: Eliane Grooms T  [facility]  Visit Type: OFFICE VISIT  Date: 06/16/2021  Medication: HYDROcodone  bit-homatropine (HYCODAN) 5-1.5 MG/5ML syrup  Has the patient contacted their pharmacy? Yes (Agent: If no, request that the patient contact the pharmacy for the refill. If patient does not wish to contact the pharmacy document the reason why and proceed with request.) (Agent: If yes, when and what did the pharmacy advise?)  Is this the correct pharmacy for this prescription? Yes If no, delete pharmacy and type the correct one.  This is the patient's preferred pharmacy:   [facility] [address] [place] - [address] [place] [address] Phone: [phone] Fax: [phone]  Has the prescription been filled recently? No  Is the patient out of the medication? Yes  Has the patient been seen for an appointment in the last year OR does the patient have an upcoming appointment? Yes  Can we respond through MyChart? Yes  Agent: Please be advised that Rx refills may take up to 3 business days. We ask that you follow-up with your pharmacy.

## 2024-04-24 ENCOUNTER — Other Ambulatory Visit: Payer: Self-pay | Admitting: Pulmonary Disease

## 2024-04-24 NOTE — Telephone Encounter (Signed)
 Copied from CRM [id number]. Topic: Clinical - Medication Refill >> Apr 24, 2024  1:41 PM Crist Dominion wrote: Medication: HYDROcodone  bit-homatropine (HYCODAN) 5-1.5 MG/5ML syrup  Has the patient contacted their pharmacy? No, needs a refill before leaving for vacation next week.  (Agent: If no, request that the patient contact the pharmacy for the refill. If patient does not wish to contact the pharmacy document the reason why and proceed with request.) (Agent: If yes, when and what did the pharmacy advise?)  This is the patient's preferred pharmacy:  [facility] [address], [place] - [address] [place] [address] Phone: [phone] Fax: [phone]    Is this the correct pharmacy for this prescription? Yes If no, delete pharmacy and type the correct one.   Has the prescription been filled recently? yes  Is the patient out of the medication? Will be out of it on vacation if she does not fill it before leaving.   Has the patient been seen for an appointment in the last year OR does the patient have an upcoming appointment? Yes  Can we respond through MyChart? No  Agent: Please be advised that Rx refills may take up to 3 business days. We ask that you follow-up with your pharmacy.

## 2024-04-24 NOTE — Telephone Encounter (Signed)
 Pt is requesting Hycodan refill to be sent to [facility]

## 2024-04-25 MED ORDER — HYDROCODONE BIT-HOMATROP MBR 5-1.5 MG/5ML PO SOLN: 5.0000 mL | Freq: Four times a day (QID) | ORAL | 0 refills | Status: DC | PRN

## 2024-05-15 ENCOUNTER — Other Ambulatory Visit: Payer: Self-pay | Admitting: Pulmonary Disease

## 2024-05-15 NOTE — Telephone Encounter (Signed)
**Note De-identified  [place] Obfuscation** Please advise 

## 2024-05-15 NOTE — Telephone Encounter (Signed)
 Copied from CRM [id number]. Topic: Clinical - Medication Refill >> May 15, 2024 11:08 AM Hilton Lucky wrote: Medication: HYDROcodone  bit-homatropine (HYCODAN) 5-1.5 MG/5ML syrup  Has the patient contacted their pharmacy? No  This is the patient's preferred pharmacy:  [facility] [address] [place] - [address] [place] [address] Phone: [phone] Fax: [phone] Is this the correct pharmacy for this prescription? Yes If no, delete pharmacy and type the correct one.   Has the prescription been filled recently? No  Is the patient out of the medication? Yes  Has the patient been seen for an appointment in the last year OR does the patient have an upcoming appointment? Yes  Can we respond through MyChart? Yes  Agent: Please be advised that Rx refills may take up to 3 business days. We ask that you follow-up with your pharmacy.

## 2024-05-16 MED ORDER — HYDROCODONE BIT-HOMATROP MBR 5-1.5 MG/5ML PO SOLN: 5.0000 mL | Freq: Four times a day (QID) | ORAL | 0 refills | Status: DC | PRN

## 2024-06-01 NOTE — Telephone Encounter (Signed)
 Copied from CRM [id number]. Topic: Clinical - Medication Refill >> Jun 01, 2024 11:34 AM Rilla B wrote: Medication:  HYDROcodone  bit-homatropine (HYCODAN) 5-1.5 MG/5ML syrup    Has the patient contacted their pharmacy? Yes (Agent: If no, request that the patient contact the pharmacy for the refill. If patient does not wish to contact the pharmacy document the reason why and proceed with request.) (Agent: If yes, when and what did the pharmacy advise?)  This is the patient's preferred pharmacy:  [facility] [address], [place] - [address] [place] [address] Phone: [phone] Fax: [phone]  Is this the correct pharmacy for this prescription? Yes If no, delete pharmacy and type the correct one.   Has the prescription been filled recently? Yes  Is the patient out of the medication? Yes  Has the patient been seen for an appointment in the last year OR does the patient have an upcoming appointment? Yes  Can we respond through MyChart? Yes  Agent: Please be advised that Rx refills may take up to 3 business days. We ask that you follow-up with your pharmacy.

## 2024-06-01 NOTE — Telephone Encounter (Signed)
 Received refill request from patient's pharmacy.  Thank you.

## 2024-06-05 ENCOUNTER — Telehealth: Payer: Self-pay

## 2024-06-05 NOTE — Telephone Encounter (Unsigned)
 Copied from CRM [id number]. Topic: Clinical - Prescription Issue >> Jun 05, 2024  8:53 AM Celestine FALCON wrote: Reason for CRM: Pt is calling back after requesting a refill for the prescription for HYDROcodone  bit-homatropine (HYCODAN) 5-1.5 MG/5ML syrup. I let her know that it can take up to 3 business days for the request to be completed, and to follow up with her pharmacy. Pt wanted another message to be sent over for the medication refill.   Pharmacy: [facility] [address] [place] - [address]  [address]  [place] [address]  Phone: [phone] Fax: [phone]   Pt's phone number: [phone]

## 2024-06-05 NOTE — Telephone Encounter (Signed)
 Please see if DOD can sign this RX. Dr. Theophilus gone all week and PT calling again. Would not have until he returns after the holiday. TY.

## 2024-06-06 MED ORDER — HYDROCODONE BIT-HOMATROP MBR 5-1.5 MG/5ML PO SOLN
5.0000 mL | Freq: Four times a day (QID) | ORAL | 0 refills | Status: DC | PRN
Start: 2024-06-06 — End: 2024-06-22

## 2024-06-06 NOTE — Telephone Encounter (Signed)
 Patient is calling back concerning the medication she is needing to get filled . She still has not heard from anyone and is requesting to speak with office manager. I did let her know someone is working on it . Calling cal line now . Getting transferred to office manager now to speak with patient

## 2024-06-06 NOTE — Telephone Encounter (Signed)
 Refill has been called in- patient is advised.

## 2024-06-06 NOTE — Telephone Encounter (Signed)
 Pt is aware and voiced her understanding.  Nothing further needed.

## 2024-06-06 NOTE — Telephone Encounter (Signed)
 Patient called again upset that her cough syrup has not been called in- sent secure chat to Dr. Theophilus.

## 2024-06-06 NOTE — Telephone Encounter (Signed)
 Done

## 2024-06-22 ENCOUNTER — Other Ambulatory Visit: Payer: Self-pay | Admitting: Pulmonary Disease

## 2024-06-22 MED ORDER — HYDROCODONE BIT-HOMATROP MBR 5-1.5 MG/5ML PO SOLN: 5.0000 mL | Freq: Four times a day (QID) | ORAL | 0 refills | Status: DC | PRN

## 2024-06-22 NOTE — Telephone Encounter (Signed)
Hycodan refill

## 2024-06-22 NOTE — Telephone Encounter (Unsigned)
 Copied from CRM [id number]. Topic: Clinical - Medication Refill >> Jun 22, 2024  3:01 PM Rilla B wrote: Medication:  HYDROcodone  bit-homatropine (HYCODAN) 5-1.5 MG/5ML syrup   Has the patient contacted their pharmacy? Yes (Agent: If no, request that the patient contact the pharmacy for the refill. If patient does not wish to contact the pharmacy document the reason why and proceed with request.) (Agent: If yes, when and what did the pharmacy advise?)  This is the patient's preferred pharmacy:  [facility] [address] [place] - [address] [place] [address] Phone: [phone] Fax: [phone]  Is this the correct pharmacy for this prescription? Yes If no, delete pharmacy and type the correct one.   Has the prescription been filled recently? Yes  Is the patient out of the medication? No  Has the patient been seen for an appointment in the last year OR does the patient have an upcoming appointment? Yes  Can we respond through MyChart? Yes  Agent: Please be advised that Rx refills may take up to 3 business days. We ask that you follow-up with your pharmacy.

## 2024-06-22 NOTE — Telephone Encounter (Signed)
 Refill sent.

## 2024-07-11 ENCOUNTER — Other Ambulatory Visit: Payer: Self-pay | Admitting: Pulmonary Disease

## 2024-07-11 NOTE — Telephone Encounter (Signed)
 Please Advise

## 2024-07-11 NOTE — Telephone Encounter (Unsigned)
 Copied from CRM #[id number]. Topic: Clinical - Medication Refill >> Jul 11, 2024  9:31 AM Benton O wrote: Medication: HYDROcodone  bit-homatropine (HYCODAN) 5-1.5 MG/5ML syrup  Has the patient contacted their pharmacy? No always call doctor  (Agent: If no, request that the patient contact the pharmacy for the refill. If patient does not wish to contact the pharmacy document the reason why and proceed with request.) (Agent: If yes, when and what did the pharmacy advise?)  This is the patient's preferred pharmacy:  [facility] [address] [place] - [address] [place] [address] Phone: [phone] Fax: [phone]  [id number] [place], [place] - [address] [facility] [place] [place] [place] [address] Phone: [phone] Fax: [phone]  [facility] [place] [place] - [address] [place] [address] Phone: [phone] Fax: [phone]  Is this the correct pharmacy for this prescription? Yes If no, delete pharmacy and type the correct one.  [facility] [address], [place] - [address] [place] [address] Phone: [phone] Fax: [phone]   Has the prescription been filled recently? No  Is the patient out of the medication? No couple days left   Has the patient been seen for an appointment in the last year OR does the patient have an upcoming appointment? Yes  Can we respond through MyChart? Yes  Agent: Please be advised that Rx refills may take up to 3 business days. We ask that you follow-up with your pharmacy.

## 2024-07-12 ENCOUNTER — Other Ambulatory Visit: Payer: Self-pay | Admitting: Pulmonary Disease

## 2024-07-12 ENCOUNTER — Ambulatory Visit: Payer: Self-pay

## 2024-07-12 ENCOUNTER — Ambulatory Visit: Admitting: Pulmonary Disease

## 2024-07-12 ENCOUNTER — Encounter: Payer: Self-pay | Admitting: Pulmonary Disease

## 2024-07-12 MED ORDER — HYDROCODONE BIT-HOMATROP MBR 5-1.5 MG/5ML PO SOLN: 5.0000 mL | Freq: Four times a day (QID) | ORAL | 0 refills | Status: DC | PRN

## 2024-07-12 MED ORDER — PREDNISONE 20 MG PO TABS: 20.0000 mg | ORAL_TABLET | Freq: Every day | ORAL | 0 refills | Status: AC

## 2024-07-12 NOTE — Progress Notes (Deleted)
 Patient scheduled for an acute visit today  Tested positive for COVID today  Diarrhea yesterday, woke up with a sore throat  Has been taking over-the-counter medicines, Mucinex  Will calling cough medicine and steroids  Cancel in person visit for today

## 2024-07-12 NOTE — Telephone Encounter (Signed)
 Copied from CRM #[id number]. Topic: Clinical - Red Word Triage >> Jul 12, 2024  1:57 PM Nathanel DEL wrote: Red Word that prompted transfer to Nurse Triage: pt called for cough med.  Specialist failed to report pt also has covid.  Tested positive today.  Has severe chills/under 3 blankets, and a sweat suit.  Need cough med asap   FYI Only or Action Required?: Action required by provider: medication refill request, clinical question for provider, and update on patient condition.  Patient is followed in Pulmonology for  COPD, ILD, last seen on 10/27/2023 by Mannam, Praveen, MD.  Called Nurse Triage reporting No chief complaint on file..  Symptoms began yesterday.  Interventions attempted: Rescue inhaler and Maintenance inhaler.  Symptoms are: gradually worsening.  Triage Disposition: No disposition on file.  Patient/caregiver understands and will follow disposition?:   Answer Assessment - Initial Assessment Questions 1. COVID-19 DIAGNOSIS: How do you know that you have COVID? (e.g., positive lab test or self-test, diagnosed by doctor or NP/PA, symptoms after exposure).     Home Test Positive, on today  2. COVID-19 EXPOSURE: Was there any known exposure to COVID before the symptoms began? CDC Definition of close contact: within 6 feet (2 meters) for a total of 15 minutes or more over a 24-hour period.       Unsure of exposure, denies being around anyone that was sick  3. ONSET: When did the COVID-19 symptoms start?      Yesterday  4. WORST SYMPTOM: What is your worst symptom? (e.g., cough, fever, shortness of breath, muscle aches)     Cough, Sore Throat  5. COUGH: Do you have a cough? If Yes, ask: How bad is the cough?       Yes  6. FEVER: Do you have a fever? If Yes, ask: What is your temperature, how was it measured, and when did it start?     Chills, Unsure of temperature  7. RESPIRATORY STATUS: Describe your breathing? (e.g., normal; shortness of breath, wheezing,  unable to speak)        8. BETTER-SAME-WORSE: Are you getting better, staying the same or getting worse compared to yesterday?  If getting worse, ask, In what way?     Worse  9. OTHER SYMPTOMS: Do you have any other symptoms?  (e.g., chills, fatigue, headache, loss of smell or taste, muscle pain, sore throat)     Chills, Diarrhea, Sore Throat  10. HIGH RISK DISEASE: Do you have any chronic medical problems? (e.g., asthma, heart or lung disease, weak immune system, obesity, etc.)       COPD  11. VACCINE: Have you had the COVID-19 vaccine? If Yes, ask: Which one, how many shots, when did you get it?       Yes, fully vaccinated, 2  12. PREGNANCY: Is there any chance you are pregnant? When was your last menstrual period?       No and No  13. O2 SATURATION MONITOR:  Do you use an oxygen saturation monitor (pulse oximeter) at home? If Yes, ask What is your reading (oxygen level) today? What is your usual oxygen saturation reading? (e.g., 95%)        Unsure   Medication Refill Request per Patient on Hydrocodan Syrup  Protocols used: Coronavirus (COVID-19) Diagnosed or Suspected-A-AH

## 2024-07-13 ENCOUNTER — Telehealth: Payer: Self-pay

## 2024-07-13 NOTE — Telephone Encounter (Signed)
 Copied from CRM [id number]. Topic: Clinical - Medication Refill >> Jul 12, 2024  2:15 PM Nurse Lyndy H wrote: Medication: HYDROcodone  bit-homatropine (HYCODAN) 5-1.5 MG/5ML syrup  Has the patient contacted their pharmacy? No (Agent: If no, request that the patient contact the pharmacy for the refill. If patient does not wish to contact the pharmacy document the reason why and proceed with request.) (Agent: If yes, when and what did the pharmacy advise?)  This is the patient's preferred pharmacy:  [facility] [address], [place] - [address] [place] [address] Phone: [phone] Fax: [phone]  Is this the correct pharmacy for this prescription? Yes If no, delete pharmacy and type the correct one.   Has the prescription been filled recently? Yes  Is the patient out of the medication? Yes  Has the patient been seen for an appointment in the last year OR does the patient have an upcoming appointment? Yes  Can we respond through MyChart? Yes.  Agent: Please be advised that Rx refills may take up to 3 business days. We ask that you follow-up with your pharmacy.  This was sent in yesterday.NFN

## 2024-07-13 NOTE — Telephone Encounter (Signed)
 Pt was sent meds in 8/6 by Dr. Neda

## 2024-07-26 ENCOUNTER — Ambulatory Visit: Payer: Self-pay | Admitting: Pulmonary Disease

## 2024-07-26 ENCOUNTER — Other Ambulatory Visit: Payer: Self-pay | Admitting: Internal Medicine

## 2024-07-26 MED ORDER — UMECLIDINIUM-VILANTEROL 62.5-25 MCG/ACT IN AEPB: 1.0000 | INHALATION_SPRAY | Freq: Every day | RESPIRATORY_TRACT | 11 refills | Status: DC

## 2024-07-26 NOTE — Telephone Encounter (Signed)
 Pt is requesting a refill on Hycodan 5-1.5 mg/ 5mL  Last seen on 10-27-23 by Dr Theophilus, has an upcoming appt on 11-06-24 with Dr Theophilus. Last written on 07-12-24

## 2024-07-26 NOTE — Telephone Encounter (Unsigned)
 Copied from CRM [id number]. Topic: Clinical - Medication Refill >> Jul 26, 2024  1:03 PM Nathanel DEL wrote: Medication: HYDROcodone  bit-homatropine (HYCODAN) 5-1.5 MG/5ML syrup  Has the patient contacted their pharmacy? No (Agent: If no, request that the patient contact the pharmacy for the refill. If patient does not wish to contact the pharmacy document the reason why and proceed with request.) (Agent: If yes, when and what did the pharmacy advise?)  This is the patient's preferred pharmacy:  [facility] [address] [place] - [address] [place] [address] Phone: [phone] Fax: [phone]  Is this the correct pharmacy for this prescription? Yes If no, delete pharmacy and type the correct one.   Has the prescription been filled recently? Yes  Is the patient out of the medication? Yes  Has the patient been seen for an appointment in the last year OR does the patient have an upcoming appointment? Yes  Can we respond through MyChart? No  Agent: Please be advised that Rx refills may take up to 3 business days. We ask that you follow-up with your pharmacy.

## 2024-07-26 NOTE — Telephone Encounter (Signed)
 FYI Only or Action Required?: Action required by provider: Needs call back asap to discuss next steps with pt given eye doc's diagnosis/concerns for steroid-induced glaucoma since pt taking steroid med(s) for pulm.  Patient is followed in Pulmonology for COPD and ILD, last seen on 10/27/2023 by Mannam, Praveen, MD.  Called Nurse Triage reporting Glaucoma, medication questions, Medication Reaction, and denies new or worsening symptoms.  Symptoms began denies current symptoms.  Interventions attempted: Other: regular eye doc appt yesterday happened to find glaucoma.  Symptoms are: unchanged.  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  Dr Neda prescribed  7 day steroid after covid 2 weeks ago.  She would like him to know Dr Theophilus also has her on a steroid inhaler..Steroids after covid and then the inhaler. Pt would like the dr take a look and see if she should come off the steroid inhaler          Reason for Disposition  [1] Caller has URGENT medicine question about med that primary care doctor (or NP/PA) or specialist prescribed AND [2] triager unable to answer question  Answer Assessment - Initial Assessment Questions 1. NAME of MEDICINE: What medicine(s) are you calling about?     Steroids, was recently on steroid for covid prescribed by Dr. Neda, also on steroid inhaler 2. QUESTION: What is your question? (e.g., double dose of medicine, side effect)     Eye doc knows it's glaucoma, eye doc thinks from steroid use, wants her to come back and see in few months If it is the steroids need to not take any with steroids with any route Just saw eye doc yesterday, was going in for regular appt, not thinking anything was wrong, so no symptoms Steroid  had helped so much with covid, wish hadn't taken them now, is what it is, pretty much now recovered from covid, no worsening or new symptoms  Asking what to do going forward in terms of meds  3. PRESCRIBER: Who prescribed the medicine? Reason: if prescribed by specialist, call should be referred to that group.     Dr. Theophilus 4. SYMPTOMS: Do you have any symptoms? If Yes, ask: What symptoms are you having?  How bad are the symptoms (e.g., mild, moderate, severe)     Denies new or worsening symptoms   Sending message to office for call back to discuss next steps with pt  Protocols used: Medication Question Call-A-AH

## 2024-07-26 NOTE — Telephone Encounter (Signed)
 Glaucoma is a rare side effect of inhaled corticosteroids but since she is having eye problems we can trial her of them I will switch Trelegy to Anoro which does not have the steroid in it.  The order has been sent to her pharmacy.  Please inform patient.

## 2024-07-27 MED ORDER — HYDROCODONE BIT-HOMATROP MBR 5-1.5 MG/5ML PO SOLN: 5.0000 mL | Freq: Four times a day (QID) | ORAL | 0 refills | Status: DC | PRN

## 2024-07-27 NOTE — Telephone Encounter (Signed)
 Called and informed patient.NFN

## 2024-07-28 ENCOUNTER — Telehealth: Payer: Self-pay

## 2024-07-28 ENCOUNTER — Other Ambulatory Visit: Payer: Self-pay

## 2024-07-28 NOTE — Telephone Encounter (Signed)
 Copied from CRM #[id number]. Topic: Clinical - Prescription Issue >> Jul 27, 2024  2:33 PM Joesph PARAS wrote: Reason for CRM: Patient is calling to state that Anoro inhaler is not at pharmacy. Pharmacy states has not received a prescription. Patient requesting this be sent in or an update be provided.    Spoke with PT Rx was sent the day before confirmed right pharmacy - request she ask pharmacy to fill may have been put on file   -NFN

## 2024-07-31 MED ORDER — UMECLIDINIUM-VILANTEROL 62.5-25 MCG/ACT IN AEPB: 1.0000 | INHALATION_SPRAY | Freq: Every day | RESPIRATORY_TRACT | 11 refills | Status: DC

## 2024-07-31 NOTE — Telephone Encounter (Signed)
 Copied from CRM [id number]. Topic: Clinical - Prescription Issue >> Jul 28, 2024  5:01 PM Theresa Franco wrote: Reason for CRM: PtT CALLED STATED umeclidinium-vilanterol (ANORO ELLIPTA ) 62.5-25 MCG/ACT AEPB IS NOT AT [facility]. IT IS NOT SHOWING ATTACHED TO THE PRESCRIPTION PT SAID TO CALL [facility] TO CONFIRM. THANKS  Called and spoke with the pt. Rx has been re-sent to pharmacy, previously sent in as phone in so it wasn't received.   Dr. Theophilus pt also stated her nasal spray Azelastine -Fluticasone  has a steroid in it and she was told by eye Doctor to stop steroid meds. Pt is requesting nasal spray with no steroid, can you please advise.

## 2024-08-01 MED ORDER — AZELASTINE HCL 0.1 % NA SOLN: 1.0000 | Freq: Two times a day (BID) | NASAL | 12 refills | Status: AC

## 2024-08-01 NOTE — Telephone Encounter (Signed)
 I have sent in a prescription for Astelin  nasal spray without the steroid component.

## 2024-08-01 NOTE — Telephone Encounter (Signed)
 I called and spoke to pt. Pt informed of Dr Jiles note and pt verbalized understanding. NFN

## 2024-08-01 NOTE — Addendum Note (Signed)
 Addended byBETHA THEOPHILUS ROOSEVELT on: 08/01/2024 01:04 PM   Modules accepted: Orders

## 2024-08-11 ENCOUNTER — Other Ambulatory Visit: Payer: Self-pay | Admitting: Pulmonary Disease

## 2024-08-11 NOTE — Telephone Encounter (Signed)
 Hyocodan RX

## 2024-08-11 NOTE — Telephone Encounter (Unsigned)
 Copied from CRM [id number]. Topic: Clinical - Medication Refill >> Aug 11, 2024 10:01 AM Russell PARAS wrote: Medication: HYDROcodone  bit-homatropine (HYCODAN) 5-1.5 MG/5ML syrup   Has the patient contacted their pharmacy? Yes (Agent: If no, request that the patient contact the pharmacy for the refill. If patient does not wish to contact the pharmacy document the reason why and proceed with request.) (Agent: If yes, when and what did the pharmacy advise?)  This is the patient's preferred pharmacy:  [facility] [address] [place] - [address] [place] [address] Phone: [phone] Fax: [phone]  Is this the correct pharmacy for this prescription? Yes If no, delete pharmacy and type the correct one.   Has the prescription been filled recently? yes  Is the patient out of the medication? No  Has the patient been seen for an appointment in the last year OR does the patient have an upcoming appointment? Yes  Can we respond through MyChart? Yes  Agent: Please be advised that Rx refills may take up to 3 business days. We ask that you follow-up with your pharmacy.

## 2024-08-13 MED ORDER — HYDROCODONE BIT-HOMATROP MBR 5-1.5 MG/5ML PO SOLN: 5.0000 mL | Freq: Four times a day (QID) | ORAL | 0 refills | Status: DC | PRN

## 2024-08-29 ENCOUNTER — Other Ambulatory Visit: Payer: Self-pay | Admitting: Pulmonary Disease

## 2024-08-29 NOTE — Telephone Encounter (Unsigned)
 Copied from CRM #[id number]. Topic: Clinical - Medication Refill >> Aug 29, 2024 11:58 AM Russell PARAS wrote: Medication: HYDROcodone  bit-homatropine (HYCODAN) 5-1.5 MG/5ML syrup   Has the patient contacted their pharmacy? Yes (Agent: If no, request that the patient contact the pharmacy for the refill. If patient does not wish to contact the pharmacy document the reason why and proceed with request.) (Agent: If yes, when and what did the pharmacy advise?)  This is the patient's preferred pharmacy:  [facility] [address] [place] - [address] [place] [address] Phone: [phone] Fax: [phone]  Is this the correct pharmacy for this prescription? Yes If no, delete pharmacy and type the correct one.   Has the prescription been filled recently? Yes  Is the patient out of the medication? No  Has the patient been seen for an appointment in the last year OR does the patient have an upcoming appointment? Yes, 07/12/2024 w/Olalere  Can we respond through MyChart? Yes  Agent: Please be advised that Rx refills may take up to 3 business days. We ask that you follow-up with your pharmacy.

## 2024-08-30 NOTE — Telephone Encounter (Signed)
Hycodan refill

## 2024-08-31 ENCOUNTER — Encounter: Payer: Self-pay | Admitting: Pulmonary Disease

## 2024-08-31 MED ORDER — HYDROCODONE BIT-HOMATROP MBR 5-1.5 MG/5ML PO SOLN: 5.0000 mL | Freq: Four times a day (QID) | ORAL | 0 refills | Status: DC | PRN

## 2024-09-18 ENCOUNTER — Other Ambulatory Visit: Payer: Self-pay | Admitting: Pulmonary Disease

## 2024-09-18 MED ORDER — HYDROCODONE BIT-HOMATROP MBR 5-1.5 MG/5ML PO SOLN: 5.0000 mL | Freq: Four times a day (QID) | ORAL | 0 refills | Status: DC | PRN

## 2024-09-18 NOTE — Telephone Encounter (Signed)
 Pt requesting refill of Hycodan - please advise, thank you!  LOV: 10/27/2023 Last Fill: 08/31/2024

## 2024-09-18 NOTE — Telephone Encounter (Signed)
 Copied from CRM [id number]. Topic: Clinical - Medication Refill >> Sep 18, 2024 10:20 AM Nathanel DEL wrote: Medication: HYDROcodone  bit-homatropine (HYCODAN) 5-1.5 MG/5ML syrup  Has the patient contacted their pharmacy? No Pt wants you to know Dr Theophilus is her dr here, b/c the last time it was filled another dr filled it. (Dr Neda) This is the patient's preferred pharmacy:  [facility] [address] [place] - [address] [place] [address] Phone: [phone] Fax: [phone]  Is this the correct pharmacy for this prescription? Yes If no, delete pharmacy and type the correct one.   Has the prescription been filled recently? Yes  Is the patient out of the medication? Yes  Has the patient been seen for an appointment in the last year OR does the patient have an upcoming appointment? Yes  Can we respond through MyChart? Yes  Agent: Please be advised that Rx refills may take up to 3 business days. We ask that you follow-up with your pharmacy.

## 2024-09-19 ENCOUNTER — Encounter: Payer: Self-pay | Admitting: Pulmonary Disease

## 2024-10-05 ENCOUNTER — Other Ambulatory Visit: Payer: Self-pay | Admitting: Pulmonary Disease

## 2024-10-05 NOTE — Telephone Encounter (Unsigned)
 Copied from CRM #[id number]. Topic: Clinical - Medication Refill >> Oct 05, 2024 12:21 PM Isabell A wrote: Medication: HYDROcodone  bit-homatropine (HYCODAN) 5-1.5 MG/5ML syrup [[id number]]  Has the patient contacted their pharmacy? No (Agent: If no, request that the patient contact the pharmacy for the refill. If patient does not wish to contact the pharmacy document the reason why and proceed with request.) (Agent: If yes, when and what did the pharmacy advise?)  This is the patient's preferred pharmacy:  [facility] [address], [place] - [address] [place] [address] Phone: [phone] Fax: [phone]  Is this the correct pharmacy for this prescription? Yes If no, delete pharmacy and type the correct one.   Has the prescription been filled recently? Yes  Is the patient out of the medication? No  Has the patient been seen for an appointment in the last year OR does the patient have an upcoming appointment? Yes  Can we respond through MyChart? No  Agent: Please be advised that Rx refills may take up to 3 business days. We ask that you follow-up with your pharmacy.

## 2024-10-05 NOTE — Telephone Encounter (Signed)
 Pt requesting refill of controlled medication. Please advise, thank you!  LOV: 10/27/23 Last Fill: 08/30/24

## 2024-10-06 MED ORDER — HYDROCODONE BIT-HOMATROP MBR 5-1.5 MG/5ML PO SOLN: 5.0000 mL | Freq: Four times a day (QID) | ORAL | 0 refills | Status: DC | PRN

## 2024-10-20 ENCOUNTER — Other Ambulatory Visit: Payer: Self-pay | Admitting: Pulmonary Disease

## 2024-10-20 NOTE — Telephone Encounter (Signed)
 Copied from CRM [id number]. Topic: Clinical - Medication Refill >> Oct 20, 2024 10:21 AM Celestine FALCON wrote: Medication: HYDROcodone  bit-homatropine (HYCODAN) 5-1.5 MG/5ML syrup   Has the patient contacted their pharmacy? Yes (Agent: If no, request that the patient contact the pharmacy for the refill. If patient does not wish to contact the pharmacy document the reason why and proceed with request.) (Agent: If yes, when and what did the pharmacy advise?)  This is the patient's preferred pharmacy:  [facility] [address] [place] - [address] [place] [address] Phone: [phone] Fax: [phone]  Is this the correct pharmacy for this prescription? Yes If no, delete pharmacy and type the correct one.   Has the prescription been filled recently? Yes  Is the patient out of the medication? Yes  Has the patient been seen for an appointment in the last year OR does the patient have an upcoming appointment? Yes  Can we respond through MyChart? Yes  Agent: Please be advised that Rx refills may take up to 3 business days. We ask that you follow-up with your pharmacy.

## 2024-10-23 ENCOUNTER — Encounter: Payer: Self-pay | Admitting: Pulmonary Disease

## 2024-10-24 MED ORDER — HYDROCODONE BIT-HOMATROP MBR 5-1.5 MG/5ML PO SOLN: 5.0000 mL | Freq: Four times a day (QID) | ORAL | 0 refills | Status: DC | PRN

## 2024-10-24 NOTE — Telephone Encounter (Signed)
 Awaiting Dr. Theophilus response in separate Mychart encounter 11/17.

## 2024-10-24 NOTE — Telephone Encounter (Signed)
 Copied from CRM [id number]. Topic: Clinical - Prescription Issue >> Oct 23, 2024  4:39 PM Theresa Franco wrote: Reason for CRM: Pt is calling regarding the med refill request sent on 10/20/2024 for HYDROcodone  bit-homatropine (HYCODAN) 5-1.5 MG/5ML syrup. Pt is wanting this medication urgently and also sent over a message through MyChart today. I called [facility] with no answer. Pt is adamant about getting this medication filled, I let her know the best I could do is ask for it to be high priority and send over another CRM, but due to the time left in the day I doubt it would be filled today. Pt knows it takes up to 3 business days to get a medication refilled, and she called [id number] on Friday 10/20/2024 around 10:30am.  Awaiting Dr. Theophilus response.

## 2024-10-27 NOTE — Telephone Encounter (Signed)
 Refill prescription sent on 11/18.

## 2024-11-06 ENCOUNTER — Ambulatory Visit: Admitting: Pulmonary Disease

## 2024-11-06 DIAGNOSIS — H409 Unspecified glaucoma: Secondary | ICD-10-CM

## 2024-11-06 DIAGNOSIS — J449 Chronic obstructive pulmonary disease, unspecified: Secondary | ICD-10-CM | POA: Diagnosis not present

## 2024-11-06 DIAGNOSIS — J45909 Unspecified asthma, uncomplicated: Secondary | ICD-10-CM

## 2024-11-06 DIAGNOSIS — R059 Cough, unspecified: Secondary | ICD-10-CM

## 2024-11-06 DIAGNOSIS — Z87891 Personal history of nicotine dependence: Secondary | ICD-10-CM

## 2024-11-06 DIAGNOSIS — J4489 Other specified chronic obstructive pulmonary disease: Secondary | ICD-10-CM

## 2024-11-06 DIAGNOSIS — K219 Gastro-esophageal reflux disease without esophagitis: Secondary | ICD-10-CM | POA: Diagnosis not present

## 2024-11-06 DIAGNOSIS — R918 Other nonspecific abnormal finding of lung field: Secondary | ICD-10-CM

## 2024-11-06 MED ORDER — HYDROCODONE BIT-HOMATROP MBR 5-1.5 MG/5ML PO SOLN: 5.0000 mL | Freq: Four times a day (QID) | ORAL | 0 refills | Status: DC | PRN

## 2024-11-06 NOTE — Progress Notes (Signed)
 Theresa Franco    [id number]    10-12-1961  Primary Care Physician:Zimmer, Elsie, MD  Referring Physician: Hanna Elsie, MD [address] [place],  [place] [address]  Virtual Visit via Video Note  I connected with Nyasia Baxley on 11/10/24 at  4:00 PM EST by a video enabled telemedicine application and verified that I am speaking with the correct person using two identifiers.  Location: Patient: Home Provider: Pulmonary office, [address]   Chief complaint: Follow up for cough, COPD, asthma.  HPI: 63 y.o. with past medical history of hypertension, asthma, allergies.  She has history of chronic cough for several years.  She is evaluated by Dr. MALVA Betters, Pulmonologist at [facility], [place] .  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  She was given Xolair but had to stop after 1 dose as she developed severe hypertension.  She has been tried on breo, trelegy without any improvement in symptoms.  Her peripheral blood count does not show any eosinophilia and she was not a candidate for anti-IL5 therapy.  As per the patient she underwent a bronchoscopy in September 2018 to evaluate for endobronchial lesions which did not show any abnormalities. Medications significant for losartan.  She had been off this for > 1 year with no change in cough.  It had to be restarted after she developed hypertension after Xolair.  She has cough which is mostly nonproductive in nature.  This is more at night and sometimes keeps her up.  She has dyspnea with exertion.  Denies dyspnea at rest, no wheezing, fevers, chills.  Denies any heartburn symptoms.  Followed by Dr. Iva, Allergy .  Started on Dupixent therapy in late 2019 but stopped after a few doses as it did not make a difference and she was afraid of lowering her immunity and getting Covid CT in April 2019 shows centrilobular nodules for which she underwent bronchoscopy.  Results are nondiagnostic. She had a COPD  exacerbation in early 2020 which was treated with antibiotics and steroids.  Developed COVID-19 in January and July 2022.  She did not require hospitalization She also required 2 rounds of prednisone  on 2022 Follows up with GI in [place] for esophageal dysmotility, stricture.  Underwent EGD on 10/23 with dilatation of stricture.  Interim history:  Discussed the use of AI scribe software for clinical note transcription with the patient, who gave verbal consent to proceed.  History of Present Illness   Theresa Franco is a 63 year old female with COPD and asthma who presents with concerns about inhaler use and medication side effects.  Respiratory symptoms and inhaler use - COPD and asthma requiring inhaler therapy. - Recently discontinued Trelegy (steroid-containing inhaler) due to new diagnosis of glaucoma. - Switched to Anoro, but developed tachycardia and discomfort, leading to discontinuation of Anoro. - Continues to experience respiratory symptoms and feels she needs an inhaler for adequate control.  Ocular hypertension and glaucoma - Recently diagnosed with glaucoma. - Ophthalmologist monitoring intraocular pressure after cessation of steroid medications. - Elevated intraocular pressure at last ophthalmology visit.  Nasal symptoms - Discontinued prior steroid nasal spray due to glaucoma diagnosis. - Increased nasal symptoms since stopping steroid spray, including rhinorrhea. - Current nonsteroid nasal spray is less effective for symptom control.  Laboratory abnormalities - Persistently low total protein. - Slightly elevated hematocrit. - High total iron and iron saturation. - Low white blood cell count and low absolute neutrophil count, stable over time.  Relevant Pulmonary History Pets: 2 dogs but no birds, cats Occupation: Medical lab tech Exposures: No exposure, no mold at home Smoking history: 10-pack-year history.  Quit in 1993. Travel History: Not  significant  Outpatient Encounter Medications as of 11/06/2024  Medication Sig   albuterol  (PROAIR  HFA) 108 (90 Base) MCG/ACT inhaler Inhale 2 puffs into the lungs every 6 (six) hours as needed for wheezing or shortness of breath.   ALPRAZolam (XANAX) 0.5 MG tablet Take 0.5 mg by mouth 3 (three) times daily as needed for anxiety or sleep.   azelastine  (ASTELIN ) 0.1 % nasal spray Place 1 spray into both nostrils 2 (two) times daily. Use in each nostril as directed   buPROPion (WELLBUTRIN SR) 150 MG 12 hr tablet Take 150 mg by mouth 2 (two) times daily.   cetirizine  (ZYRTEC ) 10 MG tablet Take 10 mg by mouth daily.   gabapentin (NEURONTIN) 300 MG capsule Take 300 mg by mouth at bedtime.    HYDROcodone  bit-homatropine (HYCODAN) 5-1.5 MG/5ML syrup Take 5 mLs by mouth every 6 (six) hours as needed for cough.   ibuprofen (ADVIL,MOTRIN) 200 MG tablet Take 400 mg by mouth every 6 (six) hours as needed for headache or moderate pain.   ipratropium (ATROVENT ) 0.03 % nasal spray USE 2 SPRAY(S) IN EACH NOSTRIL 2 TO 3 TIMES DAILY AS NEEDED FOR RUNNY NOSE   ipratropium-albuterol  (DUONEB) 0.5-2.5 (3) MG/3ML SOLN Take 3 mLs by nebulization every 6 (six) hours as needed. (Patient taking differently: Take 3 mLs by nebulization every 6 (six) hours as needed (for shortness of breath or wheezing).)   losartan-hydrochlorothiazide (HYZAAR) 50-12.5 MG tablet Take 1 tablet by mouth daily.   OXYGEN Inhale 2 L into the lungs at bedtime.   pantoprazole  (PROTONIX ) 40 MG tablet Take 1 tablet by mouth twice daily   predniSONE  (DELTASONE ) 20 MG tablet Take 1 tablet (20 mg total) by mouth daily with breakfast.   umeclidinium-vilanterol (ANORO ELLIPTA ) 62.5-25 MCG/ACT AEPB Inhale 1 puff into the lungs daily.   No facility-administered encounter medications on file as of 11/06/2024.   Physical Exam: Tele  Data Reviewed: Imaging: High-resolution CT 03/10/2018-subtle centrilobular groundglass opacities, air trapping in the upper  lobes.  Tiny pulmonary nodules the largest is 3 mm.  High-res CT 09/01/2019- resolution of groundglass opacities.  Pulmonary nodules are stable.  High-res CT 04/11/2020-no interstitial lung disease, bronchial wall thickening, small hiatal hernia. I reviewed the images personally.  PFTs: Spirometry from [facility], [place]  03/10/17 FVC 2.71 [71%], pre-FEV1 1.53 [54%], post FEV1 1.70 [59%], % change 11 F/F 63 ABG 04/07/2017-7.4 2/40/73 Moderate obstruction with improvement in flow rates especially mid flows post bronchodilator suggestive of small airways disease.  03/03/18 FVC 1.70 (41%], FEV1 1.00 (31%], F/F 59, TLC 101, RV/TLC 173%, DLCO 67% Severe obstruction with air trapping, moderate diffusion defect   11/11/18 FVC 2.51 [61%], FEV1 1.51 [47%], F/F 60, TLC 100%, DLCO 82% Severe obstruction.  04/30/2020 FVC 2.28 (56%), FEV1 1.38 (44%), F/F 61, TLC 4.96 [85%], DLCO 24.42 [101%] Severe obstruction with air trapping  FENO 11/18/17-13 FENO 01/06/18-14 FENO 04/04/2018-7  Labs: CBC 11/18/17-WBC 4.7, eosinophils 1.1%, absolute eos count 100 RAST panel 11/18/17-IgE 752, sensitive to multiple allergens including cats, dogs, dust mite, pollen  Alpha-1 antitrypsin 03/03/2018-204, PI FM Alpha-1 antitrypsin 03/27/2020-123  Connective tissue serologies 04/04/2018-ANA, rheumatoid factor, CCP, hypersensitivity panel, double-stranded DNA, Ro, lab, SCL 70- negative  Bronchoscopy 04/15/2018 WBC 525, 11% lymphs, 89% monocyte macrophage Cultures- Negative Cytology-benign.  Path- benign lung and bronchial mucosa.  Sleep  Home sleep study 09/15/2019-AHI 3.5, desats to 79%  Assessment and Plan   Chronic obstructive pulmonary disease and asthma management in context of steroid-induced glaucoma risk COPD and asthma management complicated by steroid-induced glaucoma risk. Previous use of Trelegy, which contains a steroid, was discontinued due to glaucoma concerns. Anoro, which lacks a steroid, was prescribed  but not well tolerated due to side effects such as heart racing. Stiolto, another non-steroidal option, may be considered if Anoro remains intolerable. Steroids are beneficial for asthma management but must be avoided due to glaucoma risk.  Referred to pulmonary rehab.  In the past she had refused pulmonary rehab like she is working full-time but is now ready to reconsider as she is slowing down at work.  - Retry Anoro with understanding of its non-steroidal composition. - If Anoro remains intolerable, will consider switching to Stiolto. - Await ophthalmologist's evaluation on December 23rd to determine safety of resuming Trelegy. - Communicate via MyChart regarding ophthalmologist's recommendations and medication efficacy.  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 Ophthalmologist suspects primary glaucoma but advises avoiding steroids until further evaluation. Increased intraocular pressure noted upon re-evaluation. - Await ophthalmologist's evaluation on December 23rd to confirm diagnosis and discuss steroid use.  Cough - Sent Hycodan prescription for cough medicine to [facility].      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 Check levels today  Allergic rhinitis, asthma Being followed at the allergy  clinic by Dr. Iva.    GERD, esophageal dysmotility, stricture On PI Following with GI  Lung nodules Stable since 2019 and likely benign. Will not require additional follow-up She does not meet criteria for screening CT chest  Health maintenance 05/04/2018-Pneumovax Up-to-date with Covid vaccine. Recommended her to get the booster  Plan/Recommendations: - Anoro inhaler - Cough medication  I discussed the assessment and treatment plan with the patient. The patient was provided an opportunity to ask questions and all  were answered. The patient agreed with the plan and demonstrated an understanding of the instructions.   Lonna Coder MD [facility] 11/06/2024, 4:01 PM  CC: Hanna Fallow, MD

## 2024-11-06 NOTE — Patient Instructions (Signed)
  VISIT SUMMARY: You visited today to discuss concerns about your inhaler use and medication side effects related to your COPD, asthma, and recent glaucoma diagnosis.  YOUR PLAN: COPD AND ASTHMA: Your COPD and asthma management is complicated by the need to avoid steroids due to your glaucoma. You had side effects with Anoro, but it is important to manage your respiratory symptoms. -Retry using Anoro, as it does not contain steroids. -If Anoro is not tolerable, we will consider switching to Stiolto. -We will wait for your ophthalmologist's evaluation on December 23rd to determine if it is safe to resume Trelegy. -Please communicate with us  via MyChart regarding your ophthalmologist's recommendations and how well your medication is working.  GLAUCOMA: You have been diagnosed with glaucoma, and there is a concern that it may be related to steroid use. Your ophthalmologist is monitoring your condition. -Await your ophthalmologist's evaluation on December 23rd to confirm your diagnosis and discuss the use of steroids.  COUGH: You have been experiencing a cough. -A prescription for cough medicine has been sent to [facility].                      Contains text generated by Abridge.                                 Contains text generated by Abridge.

## 2024-11-20 ENCOUNTER — Other Ambulatory Visit: Payer: Self-pay | Admitting: Pulmonary Disease

## 2024-11-20 NOTE — Telephone Encounter (Signed)
 Copied from CRM [id number]. Topic: Clinical - Medication Refill >> Nov 20, 2024  9:15 AM Corean SAUNDERS wrote: Medication: HYDROcodone  bit-homatropine (HYCODAN) 5-1.5 MG/5ML syrup   Has the patient contacted their pharmacy? No - controled substance (Agent: If no, request that the patient contact the pharmacy for the refill. If patient does not wish to contact the pharmacy document the reason why and proceed with request.) (Agent: If yes, when and what did the pharmacy advise?)  This is the patient's preferred pharmacy:  [facility] [address] [place] - [address] [place] [address] Phone: [phone] Fax: [phone]  Is this the correct pharmacy for this prescription? Yes If no, delete pharmacy and type the correct one.   Has the prescription been filled recently? Yes  Is the patient out of the medication? 1 day left  Has the patient been seen for an appointment in the last year OR does the patient have an upcoming appointment? Yes  Can we respond through MyChart? no  Agent: Please be advised that Rx refills may take up to 3 business days. We ask that you follow-up with your pharmacy.

## 2024-11-22 ENCOUNTER — Encounter: Payer: Self-pay | Admitting: Pulmonary Disease

## 2024-11-22 MED ORDER — HYDROCODONE BIT-HOMATROP MBR 5-1.5 MG/5ML PO SOLN: 5.0000 mL | Freq: Four times a day (QID) | ORAL | 0 refills | Status: DC | PRN

## 2024-12-04 ENCOUNTER — Encounter: Payer: Self-pay | Admitting: Pulmonary Disease

## 2024-12-05 MED ORDER — HYDROCODONE BIT-HOMATROP MBR 5-1.5 MG/5ML PO SOLN: 5.0000 mL | Freq: Four times a day (QID) | ORAL | 0 refills | Status: DC | PRN

## 2024-12-05 MED ORDER — TRELEGY ELLIPTA 100-62.5-25 MCG/ACT IN AEPB: 1.0000 | INHALATION_SPRAY | Freq: Every day | RESPIRATORY_TRACT | 3 refills | Status: AC

## 2024-12-05 NOTE — Telephone Encounter (Signed)
 Sent the prescription for low dose telergy and cough syrup.

## 2024-12-18 ENCOUNTER — Encounter: Payer: Self-pay | Admitting: Pulmonary Disease

## 2024-12-19 MED ORDER — HYDROCODONE BIT-HOMATROP MBR 5-1.5 MG/5ML PO SOLN: 5.0000 mL | Freq: Four times a day (QID) | ORAL | 0 refills | Status: DC | PRN

## 2025-01-01 ENCOUNTER — Encounter: Payer: Self-pay | Admitting: Pulmonary Disease

## 2025-01-02 ENCOUNTER — Other Ambulatory Visit: Payer: Self-pay

## 2025-01-02 NOTE — Telephone Encounter (Signed)
 Pt needs a refill.

## 2025-01-03 MED ORDER — HYDROCODONE BIT-HOMATROP MBR 5-1.5 MG/5ML PO SOLN: 5.0000 mL | Freq: Four times a day (QID) | ORAL | 0 refills | Status: AC | PRN
